# Patient Record
Sex: Female | Born: 1965 | Race: Black or African American | Hispanic: No | Smoking: Never smoker
Health system: Southern US, Community
[De-identification: ages and names within clinical notes are randomized; demographics above are authoritative.]

## PROBLEM LIST (undated history)

## (undated) DIAGNOSIS — D649 Anemia, unspecified: Secondary | ICD-10-CM

## (undated) DIAGNOSIS — N809 Endometriosis, unspecified: Secondary | ICD-10-CM

## (undated) DIAGNOSIS — K219 Gastro-esophageal reflux disease without esophagitis: Secondary | ICD-10-CM

## (undated) DIAGNOSIS — D219 Benign neoplasm of connective and other soft tissue, unspecified: Secondary | ICD-10-CM

## (undated) DIAGNOSIS — D689 Coagulation defect, unspecified: Secondary | ICD-10-CM

## (undated) DIAGNOSIS — R011 Cardiac murmur, unspecified: Secondary | ICD-10-CM

## (undated) HISTORY — DX: Endometriosis, unspecified: N80.9

## (undated) HISTORY — DX: Benign neoplasm of connective and other soft tissue, unspecified: D21.9

## (undated) HISTORY — DX: Gastro-esophageal reflux disease without esophagitis: K21.9

## (undated) HISTORY — DX: Anemia, unspecified: D64.9

## (undated) HISTORY — DX: Cardiac murmur, unspecified: R01.1

## (undated) HISTORY — DX: Coagulation defect, unspecified: D68.9

---
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
## 1989-11-09 HISTORY — PX: MYOMECTOMY ABDOMINAL APPROACH: SUR870

## 1989-11-09 HISTORY — PX: OVARIAN CYST REMOVAL: SHX89

## 1992-11-09 DIAGNOSIS — N9489 Other specified conditions associated with female genital organs and menstrual cycle: Secondary | ICD-10-CM | POA: Insufficient documentation

## 1999-01-28 ENCOUNTER — Ambulatory Visit (HOSPITAL_COMMUNITY): Admission: RE | Admit: 1999-01-28 | Discharge: 1999-01-28 | Payer: Self-pay

## 1999-02-17 ENCOUNTER — Encounter (HOSPITAL_COMMUNITY): Admission: RE | Admit: 1999-02-17 | Discharge: 1999-05-18 | Payer: Self-pay

## 2001-11-29 ENCOUNTER — Other Ambulatory Visit: Admission: RE | Admit: 2001-11-29 | Discharge: 2001-11-29 | Payer: Self-pay | Admitting: Obstetrics and Gynecology

## 2002-03-07 ENCOUNTER — Encounter: Admission: RE | Admit: 2002-03-07 | Discharge: 2002-03-07 | Payer: Self-pay | Admitting: Nephrology

## 2002-03-07 ENCOUNTER — Encounter: Payer: Self-pay | Admitting: Nephrology

## 2002-11-30 ENCOUNTER — Other Ambulatory Visit: Admission: RE | Admit: 2002-11-30 | Discharge: 2002-11-30 | Payer: Self-pay | Admitting: Obstetrics and Gynecology

## 2002-12-01 ENCOUNTER — Encounter: Payer: Self-pay | Admitting: Obstetrics and Gynecology

## 2002-12-01 ENCOUNTER — Ambulatory Visit (HOSPITAL_COMMUNITY): Admission: RE | Admit: 2002-12-01 | Discharge: 2002-12-01 | Payer: Self-pay | Admitting: Obstetrics and Gynecology

## 2003-01-10 ENCOUNTER — Ambulatory Visit (HOSPITAL_COMMUNITY): Admission: RE | Admit: 2003-01-10 | Discharge: 2003-01-10 | Payer: Self-pay | Admitting: Obstetrics and Gynecology

## 2003-01-10 ENCOUNTER — Encounter: Payer: Self-pay | Admitting: Obstetrics and Gynecology

## 2003-02-27 ENCOUNTER — Ambulatory Visit (HOSPITAL_COMMUNITY): Admission: RE | Admit: 2003-02-27 | Discharge: 2003-02-27 | Payer: Self-pay | Admitting: Obstetrics and Gynecology

## 2003-02-27 ENCOUNTER — Encounter: Payer: Self-pay | Admitting: Obstetrics and Gynecology

## 2003-03-13 ENCOUNTER — Ambulatory Visit: Admission: RE | Admit: 2003-03-13 | Discharge: 2003-03-13 | Payer: Self-pay | Admitting: Gynecologic Oncology

## 2003-10-31 ENCOUNTER — Ambulatory Visit (HOSPITAL_COMMUNITY): Admission: RE | Admit: 2003-10-31 | Discharge: 2003-10-31 | Payer: Self-pay | Admitting: Obstetrics and Gynecology

## 2003-11-23 ENCOUNTER — Ambulatory Visit (HOSPITAL_COMMUNITY): Admission: RE | Admit: 2003-11-23 | Discharge: 2003-11-23 | Payer: Self-pay | Admitting: Obstetrics and Gynecology

## 2003-11-28 ENCOUNTER — Other Ambulatory Visit: Admission: RE | Admit: 2003-11-28 | Discharge: 2003-11-28 | Payer: Self-pay | Admitting: Obstetrics and Gynecology

## 2004-11-09 HISTORY — PX: TOTAL ABDOMINAL HYSTERECTOMY W/ BILATERAL SALPINGOOPHORECTOMY: SHX83

## 2004-12-16 ENCOUNTER — Other Ambulatory Visit: Admission: RE | Admit: 2004-12-16 | Discharge: 2004-12-16 | Payer: Self-pay | Admitting: Obstetrics and Gynecology

## 2004-12-30 ENCOUNTER — Emergency Department (HOSPITAL_COMMUNITY): Admission: EM | Admit: 2004-12-30 | Discharge: 2004-12-30 | Payer: Self-pay | Admitting: Emergency Medicine

## 2005-02-23 ENCOUNTER — Ambulatory Visit: Payer: Self-pay

## 2005-02-25 ENCOUNTER — Inpatient Hospital Stay (HOSPITAL_COMMUNITY): Admission: RE | Admit: 2005-02-25 | Discharge: 2005-02-27 | Payer: Self-pay | Admitting: Obstetrics and Gynecology

## 2005-02-25 ENCOUNTER — Encounter (INDEPENDENT_AMBULATORY_CARE_PROVIDER_SITE_OTHER): Payer: Self-pay | Admitting: Specialist

## 2008-10-29 ENCOUNTER — Ambulatory Visit (HOSPITAL_COMMUNITY): Admission: RE | Admit: 2008-10-29 | Discharge: 2008-10-29 | Payer: Self-pay | Admitting: Obstetrics and Gynecology

## 2009-01-21 ENCOUNTER — Telehealth: Payer: Self-pay | Admitting: Internal Medicine

## 2009-01-21 DIAGNOSIS — K5900 Constipation, unspecified: Secondary | ICD-10-CM | POA: Insufficient documentation

## 2009-01-21 DIAGNOSIS — D279 Benign neoplasm of unspecified ovary: Secondary | ICD-10-CM | POA: Insufficient documentation

## 2009-01-21 DIAGNOSIS — K219 Gastro-esophageal reflux disease without esophagitis: Secondary | ICD-10-CM | POA: Insufficient documentation

## 2009-01-21 DIAGNOSIS — D649 Anemia, unspecified: Secondary | ICD-10-CM | POA: Insufficient documentation

## 2009-01-21 DIAGNOSIS — R12 Heartburn: Secondary | ICD-10-CM

## 2009-01-21 DIAGNOSIS — R011 Cardiac murmur, unspecified: Secondary | ICD-10-CM

## 2009-01-22 ENCOUNTER — Ambulatory Visit: Payer: Self-pay | Admitting: Gastroenterology

## 2009-01-22 ENCOUNTER — Encounter: Payer: Self-pay | Admitting: Nurse Practitioner

## 2009-01-22 DIAGNOSIS — R740 Nonspecific elevation of levels of transaminase and lactic acid dehydrogenase [LDH]: Secondary | ICD-10-CM

## 2009-01-22 DIAGNOSIS — R110 Nausea: Secondary | ICD-10-CM

## 2009-01-22 DIAGNOSIS — R109 Unspecified abdominal pain: Secondary | ICD-10-CM

## 2009-01-26 ENCOUNTER — Encounter: Payer: Self-pay | Admitting: Nurse Practitioner

## 2009-01-29 ENCOUNTER — Ambulatory Visit: Payer: Self-pay | Admitting: Nurse Practitioner

## 2009-01-29 LAB — CONVERTED CEMR LAB
ALT: 67 units/L — ABNORMAL HIGH (ref 0–35)
AST: 35 units/L (ref 0–37)
Alkaline Phosphatase: 85 units/L (ref 39–117)
Basophils Absolute: 0.1 10*3/uL (ref 0.0–0.1)
Calcium: 9.4 mg/dL (ref 8.4–10.5)
Chloride: 106 meq/L (ref 96–112)
Creatinine, Ser: 1.1 mg/dL (ref 0.4–1.2)
Eosinophils Absolute: 0.1 10*3/uL (ref 0.0–0.7)
Hemoglobin: 12.6 g/dL (ref 12.0–15.0)
Lymphocytes Relative: 37.6 % (ref 12.0–46.0)
MCHC: 32.9 g/dL (ref 30.0–36.0)
Monocytes Absolute: 0.5 10*3/uL (ref 0.1–1.0)
Neutro Abs: 4.2 10*3/uL (ref 1.4–7.7)
Neutrophils Relative %: 53.1 % (ref 43.0–77.0)
RDW: 14.9 % — ABNORMAL HIGH (ref 11.5–14.6)

## 2009-02-05 ENCOUNTER — Ambulatory Visit: Payer: Self-pay | Admitting: Nurse Practitioner

## 2009-02-06 ENCOUNTER — Ambulatory Visit: Payer: Self-pay | Admitting: Internal Medicine

## 2009-02-13 LAB — CONVERTED CEMR LAB
ALT: 48 units/L — ABNORMAL HIGH (ref 0–35)
AST: 32 units/L (ref 0–37)
Alkaline Phosphatase: 77 units/L (ref 39–117)
Bilirubin, Direct: 0.2 mg/dL (ref 0.0–0.3)
Total Bilirubin: 1.4 mg/dL — ABNORMAL HIGH (ref 0.3–1.2)

## 2009-03-09 HISTORY — PX: OTHER SURGICAL HISTORY: SHX169

## 2009-04-04 ENCOUNTER — Encounter (HOSPITAL_COMMUNITY): Admission: RE | Admit: 2009-04-04 | Discharge: 2009-05-07 | Payer: Self-pay | Admitting: Cardiology

## 2009-04-11 ENCOUNTER — Encounter: Payer: Self-pay | Admitting: Cardiology

## 2009-04-15 ENCOUNTER — Encounter: Payer: Self-pay | Admitting: Cardiology

## 2010-11-29 ENCOUNTER — Encounter: Payer: Self-pay | Admitting: Obstetrics and Gynecology

## 2011-03-27 NOTE — H&P (Signed)
NAME:  SUBRENA, DEVEREUX NO.:  [id number]   MEDICAL RECORD NO.:  [id number]          PATIENT TYPE:  INP   LOCATION:  NA                            FACILITY:  [facility]   PHYSICIAN:  Hal Morales, M.D.DATE OF BIRTH:  [birth year]   DATE OF ADMISSION:  DATE OF DISCHARGE:                                HISTORY & PHYSICAL   HISTORY OF PRESENT ILLNESS:  Ms. Bowe is a 45 year old single African-  American female who is status post bilateral tubal ligation, para 1-0-0-1  who presents for a total abdominal hysterectomy with removal of a right  adnexal mass because of chronic pelvic pain, severe dysmenorrhea, and a  persistent right adnexal mass. For the past two years, the patient has  experienced episodic right lower quadrant pelvic pain and severe  dysmenorrhea. For the past three months, the patient's intermittent pain has  become persistent and is rated 8/10 on a 10-point scale lasting several  minutes to an hour at a time. The patient has not observed any alleviating  or exacerbating factors, change in her bowel habits, urinary tract symptoms,  nausea, vomiting, diarrhea, fever, or dyspareunia with these occurrences. No  pain medications have been taken for the intermittent pain; however, the  (9/10) pain of dysmenorrhea has only been relieved by Depo-Provera. An  ultrasound in the December 2004 revealed a right  cystic structure in the  adnexa which was unchanged since a previous study done January 2004. It was  questionable as to whether patient had a hydrosalpinx. A CT scan in January  2005 was noted to have a right adnexal structure measuring 2.5 x 4.3 x 4.5  cm which correlated with the previous ultrasound done in December of 2004.  The differential diagnosis based on these findings including hydrosalpinx,  para-ovarian/ovarian cyst, or retroperitoneal inclusion cyst. Due to  patient's past history of a left salpingo-oophorectomy (age 10) due to an  endodermal  sinus tumor, a gynecologic oncology consult was obtained in May  of 2004 with Dr. Ronita Hipps. The recommendation was followup with serial  ultrasounds at six month intervals, given that the characteristics of this  lesion appeared benign. Previous AFP, CA-125, and CEA were all within normal  limits. Additionally, Dr. Saralyn Pilar, a pelvic pain specialist was  consulted who concurred with Dr. Calton Dach assessment of a likely benign  process and management plan. After careful consideration of her options  which included continue observation, removal of right adnexal mass, and  hysterectomy with removal of the right adnexal mass, the patient has decided  to proceed with hysterectomy and right adnexal mass excision as definitive  therapy for her dysmenorrhea and persistent right adnexal mass. The patient  understands that this procedure does not guarantee resolution of her daily  pelvic pain.   PAST MEDICAL HISTORY:  OB history:  Gravida 1, para 1-0-0-1 which was  delivered by Cesarean section. GYN history:  Menarche at 45 years old. The  patient's last menstrual period was March 2003 due to Depo-Provera. She uses  Depo-Provera as a method of contraception. Denies any history of sexually  transmitted diseases. The patient underwent colposcopy  in 1998 with normal  Pap smears since that time. Her last normal Pap smear was February 2006.  Medical history is positive for anemia, left ovarian tumor, vulvar  dystrophy, gastroesophageal reflux disease, mitral valve regurgitation  requiring preoperative prophylaxis.   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 1991  myomectomy and right ovarian cystectomy with partial right oophorectomy. The  patient denied any blood transfusions or problems with anesthesia.   FAMILY HISTORY:  Diabetes mellitus, hypertension, and cancer.   SOCIAL HISTORY:  The patient is single, and she works in [employer].   HABITS:  The patient  drinks alcohol on occasion, does not use tobacco.   CURRENT MEDICATIONS:  1.  Depo-Provera 150 mg IM every 12 weeks.  2.  Prilosec 20 mg 1 tablet daily.  3.  Multivitamins 1 tablet daily.   ALLERGIES:  IV CONTRAST, IODINE, and LATEX. The patient experiences a  burning sensation and ecchymosis when exposed to these products.   REVIEW OF SYSTEMS:  Seasonal allergies, sinusitis, constipation. Otherwise  review of systems is negative except as mentioned in history of present  illness.   PHYSICAL EXAMINATION:  VITAL SIGNS:  Blood pressure 112/80, weight 156,  height is 5 feet 3 inches.  GENERAL:  Neck is supple without masses. There is no adenopathy or  thyromegaly.  HEART:  Regular rate and rhythm. There is no murmur.  LUNGS:  Clear to auscultation. There are no wheezes, rales, or rhonchi.  BACK:  No CVA tenderness.  ABDOMEN:  Bowel sounds are present. It is soft without tenderness, guarding,  rebound, or organomegaly.  EXTREMITIES:  Without clubbing, cyanosis, or edema.  PELVIC:  EGBUS is within normal limits. Vagina is rugose. Cervix is  nontender without lesions. Uterus appears normal size, shape and consistency  without tenderness. Adnexa without any tenderness; however, there is a  questionable fullness in the right adnexa. Rectovaginal without masses or  tenderness.   IMPRESSION:  1.  Pelvic pain.  2.  Persistent right adnexal mass.  3.  Dysmenorrhea.  4.  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   DISPOSITION:  As previously outlined, the patient considered the options for  management of her presenting symptoms and has consented to proceed with a  total abdominal hysterectomy with removal of a right adnexal mass and  possible right oophorectomy. The patient understands the indications for  this procedures along with their risks which include reaction to anesthesia, damage to adjacent organs, infection, excessive bleeding, and the  possibility  that this procedure may not alleviate her persistent pelvic  pain. The patient has been given a copy of the [facility]  Fleet's Phospho-Soda bowel prep instructions to be administered 24 hours  prior to her surgery. She is scheduled to undergo a total abdominal  hysterectomy with removal of a right adnexal mass and possible right  oophorectomy at [facility] on February 25, 2005 at 8:30  a.m.      EJP/MEDQ  D:  02/19/2005  T:  02/19/2005  Job:  [id number]

## 2011-03-27 NOTE — Consult Note (Signed)
NAME:  Natasha Mcknight, Natasha Mcknight                    ACCOUNT NO.:  [id number]   MEDICAL RECORD NO.:  [id number]                   PATIENT TYPE:  OUT   LOCATION:  GYN                                  FACILITY:  [facility]   PHYSICIAN:  John T. Kyla Balzarine, M.D.                 DATE OF BIRTH:  April 24, 1966   DATE OF CONSULTATION:  03/13/2003  DATE OF DISCHARGE:                                   CONSULTATION   CHIEF COMPLAINT:  The patient is a 45 year old para 1, referred by Dr.  Pennie Rushing for evaluation of a persistent adnexal mass.   HISTORY OF PRESENT ILLNESS:  The patient underwent left salpingo-  oophorectomy at age 44, for an endodermal sinus tumor.  She received VAC  chemotherapy, and has been followed without evidence of recurrent disease.  In 1994, she underwent laparotomy and right ovarian cystectomy for benign  disease.  In 1/04, she was found to have a 5.5 x 3.4 x 3.5 cm cystic,  irregular mass involving the right ovary.  Followup ultrasound on 01/10/03,  was slightly smaller at 2.7 x 4.9 x 2.4 cm, and ultrasound on 02/27/03, was  stable.  The CA-125, CEA, and alpha fetoprotein are all normal.  Beta hCG  was negative.   PAST MEDICAL HISTORY:  No major comorbidity.  Germ cells seen of the ovary  as above.   PAST SURGICAL HISTORY:  1. As above.  2. Cesarean section.   SOCIAL HISTORY:  Divorced with an 25 year old son, denies tobacco, admits to  rare ethanol.   FAMILY HISTORY:  Noncontributory.   REVIEW OF SYMPTOMS:  GENERAL:  The patient denies general systemic  complaints such as weight loss, fever, chills.  CARDIOPULMONARY:  She denies  cardiopulmonary symptoms such as dyspnea, orthopnea, PND, chest pain, or  palpitations.  GASTROINTESTINAL:  No change in bowel function, early  sciatiaty, nausea or vomiting, hematemesis, or hematochezia.  GENITOURINARY:  Denies urgency, frequency, or incontinence.  OB/GYN:  As above.  EXTREMITIES:  No edema.   PHYSICAL EXAMINATION:  VITAL SIGNS:   Weight 141 pounds, blood pressure  122/82, pulse 100, respirations 20.  GENERAL:  The patient is anxious, alert and oriented x3, in no acute  distress.  HEENT:  Benign.  Clear oropharynx.  NECK:  Supple without goiter.  LYMPH SURVEY:  No pathological lymphadenopathy.  ABDOMEN:  Soft and benign with well-healed incision.  There is no ascites,  mass, or organomegaly.  There is no hernia.  There is no back or CVA  tenderness.  EXTREMITIES:  Full range of motion without edema.  PELVIC:  External genitalia and BUS are normal to inspection and palpation.  Bladder and urethra are well supported, and there are no vaginal lesions.  Cervix is without lesions and mobile.  Bimanual and rectovaginal  examinations reveal normal anteflexed uterus with a 4 cm mobile cystic mass  of the right adnexa.  This is nontender, and there is no  cul-de-sac  nodularity on rectovaginal examination.    ASSESSMENT:  1. Asymptomatic, likely benign right adnexal mass.  2. Prior history of left salpingo-oophorectomy.  3. Endodermal sinus tumor, clinically NAD.   PLAN:  The patient is reassured regarding our thoughts that this is likely  benign.  I recommended that the cyst be followed with serial ultrasounds.  Because she has had a stable mass over the past three to four months, I  would recommend simply repeating an ultrasound at six month intervals to  document stability.  She realizes that she is at risk for losing her ovary  if we attempt cystectomy, and she does not wish total abdominal hysterectomy  and bilateral salpingo-oophorectomy as definitive treatment for this.  I  answered multiple questions posed by the patient, and she appeared  satisfied.  The patient will obtain followup with Dr. Pennie Rushing and will  return to see [place] at anytime on a p.r.n. basis.                                                John T. Kyla Balzarine, M.D.    JTS/MEDQ  D:  03/13/2003  T:  03/14/2003  Job:  [id number]   cc:   Hal Morales, M.D.  [address]  [place]  [place] [address]  Fax: [phone]   Telford Nab, R.N.

## 2011-03-27 NOTE — Discharge Summary (Signed)
NAME:  Natasha Mcknight, Natasha Mcknight NO.:  [id number]   MEDICAL RECORD NO.:  [id number]          PATIENT TYPE:  INP   LOCATION:  9304                          FACILITY:  [facility]   PHYSICIAN:  Hal Morales, M.D.DATE OF BIRTH:  [birth year]   DATE OF ADMISSION:  02/25/2005  DATE OF DISCHARGE:  02/27/2005                                 DISCHARGE SUMMARY   DISCHARGE DIAGNOSES:  1.  Right ovarian cystic mass.  2.  Pelvic pain.  3.  Dysmenorrhea.  4.  Pelvic adhesions.   OPERATION:  On the date of admission, the patient underwent an exploratory  laparotomy followed by a total abdominal hysterectomy with a right salpingo-  oophorectomy, lysis of adhesions, and pelvis washings, tolerating all  procedures well.  The patient was found to have a normal sized uterus and  was status post left salpingo-oophorectomy with adhesions between the  omentum and the left posterior uterus.  The right tube and ovary were  adherent to the posterior uterus with the ovary itself being enlarged by a 5  x 3 cm cystic mass.   HISTORY OF PRESENT ILLNESS:  Natasha Mcknight is a 45 year old African-American  female para 1, 0-0-1, who presents for a total abdominal hysterectomy with  removal of a right adnexal mass because of pelvic pain, severe dysmenorrhea,  and a persistent right adnexal mass.  Please see the patient's dictated  history and physical examination for details.   PREOPERATIVE PHYSICAL EXAMINATION:  VITAL SIGNS:  Blood pressure 112/80,  weight 156, height 5 feet 3 inches tall.  GENERAL EXAM:  Within normal limits.  PELVIC EXAM:  EG/BUS within normal limits.  Vagina is rugous.  Cervix is  nontender without lesions.  Uterus appears normal size, shape, and  consistency without tenderness.  Adnexa without any tenderness.  However,  there is a questionable fullness in the right adnexa.  Rectovaginal exam is  without masses or tenderness.   HOSPITAL COURSE:  On the date of admission, the patient  underwent the  aforementioned procedures, tolerating them all well.  The postoperative  course was unremarkable with the patient tolerating a postop hemoglobin of  8.4 (preoperative hemoglobin 11.0), and declining transfusion.  By postop  day #2, the patient had resumed bowel and bladder function and was,  therefore, deemed ready for discharge home.   DISCHARGE MEDICATIONS:  1.  Phenergan 25 mg, 1 tablet q.6h. as needed for nausea.  2.  Ferrous sulfate 325 mg, 1 tablet b.i.d. for 6 weeks.  3.  Colace 100 mg, 1 tablet b.i.d. until bowel movements are regular.  4.  Ibuprofen 1 tablet with food q.6h. for 3 days and then as needed for      pain.  5.  Prilosec 20 mg, 1 tablet daily.  6.  Tylox 1-2 tablets q.4-6h. as needed for pain.   FOLLOW UP:  The patient is scheduled for a staple removal of [facility] on March 03, 2005 at 10:30 a.m.  She is also scheduled for a  6-week postoperative visit with Dr. Pennie Rushing on Apr 01, 2005 at 10:30 a.m.   DISCHARGE INSTRUCTIONS:  The  patient was given a copy of [address] [facility] postoperative instruction sheet.  She was firmly advised to avoid  driving for 2 weeks, heavy lifting for 4 weeks, and intercourse for 6 weeks.  The patient's diet was without restriction.   FINAL PATHOLOGY:  Uterus, cervix, right fallopian tube and ovary:  Cervix--  squamous metaplasia, no dysplasia identified; inactive endometrium, no  evidence of hyperplasia or malignancy; leiomyomata intramural and  subserosal; uterine serosa, fibroid adhesions with no endometriosis or  evidence of malignancy.  Right ovary and fallopian tube--tubo-ovarian  adhesions with benign ovarian follicular cyst, benign fallopian tube, no  endometrial evidence for malignancy.  Peritoneal washings were negative.      EJP/MEDQ  D:  03/24/2005  T:  03/24/2005  Job:  [id number]

## 2011-03-27 NOTE — Op Note (Signed)
NAME:  Natasha Mcknight, Natasha Mcknight NO.:  [id number]   MEDICAL RECORD NO.:  [id number]          PATIENT TYPE:  INP   LOCATION:  9399                          FACILITY:  [facility]   PHYSICIAN:  Hal Morales, M.D.DATE OF BIRTH:  05-06-1966   DATE OF PROCEDURE:  02/25/2005  DATE OF DISCHARGE:                                 OPERATIVE REPORT   PREOPERATIVE DIAGNOSES:  1.  Right adnexal mass.  2.  Pelvic pain.  3.  Dysmenorrhea.  4.  Status post left salpingo-oophorectomy with a history of endodermal      sinus tumor.   POSTOPERATIVE DIAGNOSES:  1.  Right adnexal mass.  2.  Pelvic pain.  3.  Dysmenorrhea.  4.  Status post left salpingo-oophorectomy with a history of endodermal      sinus tumor.  5.  Right ovarian cystic mass.  6.  Pelvic adhesions.   PROCEDURES:  1.  Exploratory laparotomy.  2.  Total abdominal hysterectomy.  3.  Right salpingo-oophorectomy.  4.  Lysis of adhesions.  5.  Pelvic washing.   SURGEON:  Hal Morales, M.D.   FIRST ASSISTANT:  Naima A. Normand Sloop, M.D.   ANESTHESIA:  General orotracheal.   ESTIMATED BLOOD LOSS:  Approximately 100 mL.   COMPLICATIONS:  None.   FINDINGS:  The uterus was normal-sized.  The patient was status post left  salpingo-oophorectomy with adhesions between the omentum and the left  posterior uterus.  The right tube and ovary were adherent to the posterior  uterus.  The right ovary was enlarged by a 5 x 3 cm cystic mass.  There were  no excrescences.  The evaluation of the pelvis revealed adhesions between  the omentum and the anterior peritoneum, adhesions between the omentum and  the uterus.  There were no omental masses.  Evaluation of the upper abdomen revealed a smooth liver edge.  The kidneys  were without any palpable lesions.  There were no para-aortic palpable  lesions consistent with adenopathy.   PROCEDURE:  The patient was taken to the operating room after appropriate  identification and placed on  the operating table.  After the attainment of  adequate general anesthesia, the abdomen, perineum and the vagina were  prepped with multiple layers of Techni-Care and a Foley catheter inserted  into the bladder under sterile conditions and connected to straight  drainage.  The abdomen was draped as a sterile field.  Suprapubic injection  of 0.25% Marcaine was undertaken subcutaneously for a total of 20 mL.  A  suprapubic incision was made and the abdomen opened in layers.  The  peritoneum was entered and the above-noted finding observed on examination  of pelvis and upper abdomen.  Peritoneal washings were obtained.  The self-  retaining O'Connor-O'Sullivan retractor was placed and the bowel packed  cephalad.  The uterus was grasped at each cornual region with [facility] clamps and elevated  into the operative field.  The left uterine fundus was dissected away from the overlying adherent  omentum to identify the left round ligament.  This was suture ligated and  incised and that incision taken anteriorly to form the  bladder flap.  The  right round ligament was clamped, cut, and suture ligated and then incised  along the anterior leaf of the broad ligament to meet the other side.  The  bladder was dissected sharply off the anterior cervix.  The  infundibulopelvic ligament on the right side was isolated and the ureter  identified.  The infundibulopelvic ligament was then clamped, cut, tied with a free tie  and suture ligated.  The uterine arteries on the right and left side were  skeletonized, then clamped, cut and suture ligated.  The paracervical  tissues were clamped, cut and suture ligated successively on the right and  left side as were the uterosacral ligaments.  The sutures on the uterosacral  ligaments were held.  The vaginal angles were clamped, cut and suture  ligated and the remainder of the cervix excised from the upper vagina.  The  vaginal cuff was closed with figure-of-eight  sutures of 0 Vicryl.  Copious  irrigation carried out and hemostasis noted to be adequate.  The sutures on  the uterosacral ligament and the sutures from the vaginal angles were then  tied together and hemostasis noted to be adequate after copious irrigation.  The abdominal peritoneum was closed with running suture of 2-0 Vicryl after  all instruments and sponges had been removed.  The rectus muscles were  irrigated and made hemostatic with Bovie cautery.  The rectus fascia was  closed with running suture from each apex to the midline.  The subcutaneous  tissue was made hemostatic with Bovie cautery and irrigated and the skin  incision closed with skin staples.  A sterile dressing was applied to the  incision and the patient was awakened from general anesthesia and taken to  the recovery room in satisfactory condition having tolerated procedure well,  with sponge and instrument counts correct.   SPECIMENS TO PATHOLOGY:  Right tube and ovary, uterus and cervix.      VPH/MEDQ  D:  02/25/2005  T:  02/25/2005  Job:  [id number]

## 2012-09-23 ENCOUNTER — Ambulatory Visit (INDEPENDENT_AMBULATORY_CARE_PROVIDER_SITE_OTHER): Payer: No Typology Code available for payment source | Admitting: Obstetrics and Gynecology

## 2012-09-23 ENCOUNTER — Encounter: Payer: Self-pay | Admitting: Obstetrics and Gynecology

## 2012-09-23 VITALS — BP 142/76 | Resp 16 | Ht 64.0 in | Wt 155.0 lb

## 2012-09-23 DIAGNOSIS — Z01419 Encounter for gynecological examination (general) (routine) without abnormal findings: Secondary | ICD-10-CM

## 2012-09-23 DIAGNOSIS — Z113 Encounter for screening for infections with a predominantly sexual mode of transmission: Secondary | ICD-10-CM

## 2012-09-23 NOTE — Progress Notes (Signed)
Subjective:    Natasha Mcknight is a 46 y.o. female, No obstetric history on file., who presents for an annual exam. The patient reports no complaints but wants to be checked for STDs.  Menstrual cycle:   LMP: No LMP recorded. Patient has had a hysterectomy.             Review of Systems Pertinent items are noted in HPI. Denies pelvic pain, urinary tract symptoms, vaginitis symptoms, irregular bleeding, menopausal symptoms, change in bowel habits or rectal bleeding   Objective:    BP 142/76  Resp 16  Ht 5\' 4"  (1.626 m)  Wt 155 lb (70.308 kg)  BMI 26.61 kg/m2  Wt Readings from Last 1 Encounters:  09/23/12 155 lb (70.308 kg)   Body mass index is 26.61 kg/(m^2). General Appearance: Alert, no acute distress HEENT: Grossly normal Neck / Thyroid: Supple, no thyromegaly or cervical adenopathy Lungs: Clear to auscultation bilaterally Back: No CVA tenderness Breast Exam: No masses or nodes.No dimpling, nipple retraction or discharge. Cardiovascular: Regular rate and rhythm. 2/6 Systolic murmur at LSB Gastrointestinal: Soft, non-tender, no masses or organomegaly Pelvic Exam: EGBUS-wnl, vagina-normal rugae, cervix/uterus-surgically absent adnexae-no masses  Rectovaginal: no masses and normal sphincter tone Lymphatic Exam: Non-palpable nodes in neck, clavicular,  axillary, or inguinal regions  Skin: no rashes or abnormalities Extremities: no clubbing cyanosis or edema  Neurologic: grossly normal Psychiatric: Alert and oriented  Assessment:   Routine GYN Exam STD Testing   Plan:  STD testing (GC/CT with urine)  Reviewed revised PAP guidelines for hysterectomy however, patient wants her PAP smear today  PAP sent  RTO 1 year or prn  Meiya Wisler,[email or web address]-C

## 2012-09-23 NOTE — Progress Notes (Signed)
Patient ID: ZINNIA TINDALL, female   DOB: Feb 08, 1966, 46 y.o.   MRN: [id number] Last Pap: 2009/ hyst WNL: Yes Regular Periods:no Contraception: hyst  Monthly Breast exam:no Tetanus<34yrs:yes Nl.Bladder Function:yes Daily BMs:yes Healthy Diet:yes Calcium:no Mammogram:yes Date of Mammogram: 2009 wnl Exercise:no Have often Exercise: no Seatbelt: no Abuse at home: no Stressful work:no Sigmoid-colonoscopy: no Bone Density: No PCP: Dr. Missy Sabins Change in PMH: Hypertension Change in NWG:NFAO

## 2012-09-24 LAB — GC/CHLAMYDIA PROBE AMP, URINE
Chlamydia, Swab/Urine, PCR: NEGATIVE
GC Probe Amp, Urine: NEGATIVE

## 2012-09-24 LAB — HEPATITIS B SURFACE ANTIGEN: Hepatitis B Surface Ag: NEGATIVE

## 2012-09-24 LAB — RPR

## 2012-09-24 LAB — HEPATITIS C ANTIBODY: HCV Ab: NEGATIVE

## 2012-09-26 LAB — PAP IG W/ RFLX HPV ASCU

## 2014-05-04 ENCOUNTER — Other Ambulatory Visit: Payer: Self-pay | Admitting: Obstetrics and Gynecology

## 2014-05-04 ENCOUNTER — Other Ambulatory Visit: Payer: Self-pay

## 2014-05-04 DIAGNOSIS — Z1231 Encounter for screening mammogram for malignant neoplasm of breast: Secondary | ICD-10-CM

## 2014-05-08 ENCOUNTER — Encounter (INDEPENDENT_AMBULATORY_CARE_PROVIDER_SITE_OTHER): Payer: Self-pay

## 2014-05-08 ENCOUNTER — Ambulatory Visit: Payer: Self-pay

## 2014-05-08 ENCOUNTER — Ambulatory Visit
Admission: RE | Admit: 2014-05-08 | Discharge: 2014-05-08 | Disposition: A | Discharge status: Home / Self Care | Payer: BC Managed Care – PPO | Source: Ambulatory Visit | Attending: Obstetrics and Gynecology | Admitting: Obstetrics and Gynecology

## 2014-05-08 DIAGNOSIS — Z1231 Encounter for screening mammogram for malignant neoplasm of breast: Secondary | ICD-10-CM

## 2015-02-20 DIAGNOSIS — I10 Essential (primary) hypertension: Secondary | ICD-10-CM | POA: Diagnosis present

## 2016-12-21 ENCOUNTER — Encounter (HOSPITAL_COMMUNITY): Payer: Self-pay | Admitting: Emergency Medicine

## 2016-12-21 ENCOUNTER — Emergency Department (HOSPITAL_COMMUNITY)
Admission: EM | Admit: 2016-12-21 | Discharge: 2016-12-21 | Disposition: A | Discharge status: Home / Self Care | Payer: 59 | Attending: Emergency Medicine | Admitting: Emergency Medicine

## 2016-12-21 DIAGNOSIS — S63502A Unspecified sprain of left wrist, initial encounter: Secondary | ICD-10-CM

## 2016-12-21 DIAGNOSIS — Y939 Activity, unspecified: Secondary | ICD-10-CM | POA: Diagnosis not present

## 2016-12-21 DIAGNOSIS — S199XXA Unspecified injury of neck, initial encounter: Secondary | ICD-10-CM | POA: Diagnosis present

## 2016-12-21 DIAGNOSIS — S63501A Unspecified sprain of right wrist, initial encounter: Secondary | ICD-10-CM | POA: Insufficient documentation

## 2016-12-21 DIAGNOSIS — Z91040 Latex allergy status: Secondary | ICD-10-CM | POA: Diagnosis not present

## 2016-12-21 DIAGNOSIS — Y92410 Unspecified street and highway as the place of occurrence of the external cause: Secondary | ICD-10-CM | POA: Insufficient documentation

## 2016-12-21 DIAGNOSIS — S161XXA Strain of muscle, fascia and tendon at neck level, initial encounter: Secondary | ICD-10-CM | POA: Diagnosis not present

## 2016-12-21 DIAGNOSIS — Y999 Unspecified external cause status: Secondary | ICD-10-CM | POA: Insufficient documentation

## 2016-12-21 MED ORDER — CYCLOBENZAPRINE HCL 5 MG PO TABS: 5.0000 mg | ORAL_TABLET | Freq: Three times a day (TID) | ORAL | 0 refills | Status: DC | PRN

## 2016-12-21 MED ORDER — DICLOFENAC SODIUM 50 MG PO TBEC: 50.0000 mg | DELAYED_RELEASE_TABLET | Freq: Two times a day (BID) | ORAL | 0 refills | Status: AC
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
## 2016-12-25 ENCOUNTER — Emergency Department (HOSPITAL_COMMUNITY)
Admission: EM | Admit: 2016-12-25 | Discharge: 2016-12-25 | Disposition: A | Discharge status: Home / Self Care | Payer: 59 | Attending: Emergency Medicine | Admitting: Emergency Medicine

## 2016-12-25 ENCOUNTER — Encounter (HOSPITAL_COMMUNITY): Payer: Self-pay | Admitting: Nurse Practitioner

## 2016-12-25 ENCOUNTER — Emergency Department (HOSPITAL_COMMUNITY): Payer: 59

## 2016-12-25 DIAGNOSIS — M546 Pain in thoracic spine: Secondary | ICD-10-CM | POA: Diagnosis not present

## 2016-12-25 DIAGNOSIS — Z79899 Other long term (current) drug therapy: Secondary | ICD-10-CM | POA: Diagnosis not present

## 2016-12-25 DIAGNOSIS — Z91040 Latex allergy status: Secondary | ICD-10-CM | POA: Diagnosis not present

## 2016-12-25 DIAGNOSIS — S63501D Unspecified sprain of right wrist, subsequent encounter: Secondary | ICD-10-CM

## 2016-12-25 DIAGNOSIS — S63502D Unspecified sprain of left wrist, subsequent encounter: Secondary | ICD-10-CM | POA: Diagnosis not present

## 2016-12-25 DIAGNOSIS — S6992XD Unspecified injury of left wrist, hand and finger(s), subsequent encounter: Secondary | ICD-10-CM | POA: Diagnosis present

## 2016-12-25 MED ORDER — LIDOCAINE 5 % EX PTCH: 1.0000 | MEDICATED_PATCH | CUTANEOUS | 0 refills | Status: DC
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
## 2016-12-25 NOTE — ED Notes (Signed)
Pt left after having splints placed. Pt did not get VS or sign DC.

## 2016-12-25 NOTE — Progress Notes (Signed)
Orthopedic Tech Progress Note Patient Details:  Natasha Mcknight April 10, 1966 [id number]  Ortho Devices Type of Ortho Device: Velcro wrist splint Ortho Device/Splint Location: Applied Velcro wrist splints to right and left hand/wrist bilateral.  Pt tolerated well.  Ortho Device/Splint Interventions: Application, Adjustment   Klarke, Kenealy 12/25/2016, 8:32 PM
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
## 2016-12-25 NOTE — ED Notes (Signed)
Pt returned to room (from XR). 

## 2016-12-25 NOTE — ED Notes (Signed)
Pt ambulated to room from waiting room, tolerated well. 
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
## 2016-12-25 NOTE — Discharge Instructions (Signed)
Wear the wrist splints for comfort and support. Continue to take the medications from your previous visit. Use the pain patch on the area of your back that is hurting. Follow up with Dr. Stann Mainland if you symptoms are not improving. Return here as needed.

## 2016-12-25 NOTE — ED Notes (Signed)
Patient transported to X-ray 

## 2017-08-31 ENCOUNTER — Other Ambulatory Visit: Payer: Self-pay | Admitting: Obstetrics and Gynecology

## 2017-08-31 ENCOUNTER — Other Ambulatory Visit: Payer: Self-pay | Admitting: Family Medicine

## 2017-08-31 DIAGNOSIS — Z1231 Encounter for screening mammogram for malignant neoplasm of breast: Secondary | ICD-10-CM

## 2017-09-22 ENCOUNTER — Ambulatory Visit
Admission: RE | Admit: 2017-09-22 | Discharge: 2017-09-22 | Disposition: A | Discharge status: Home / Self Care | Payer: 59 | Source: Ambulatory Visit | Attending: Family Medicine | Admitting: Family Medicine

## 2017-09-22 DIAGNOSIS — Z1231 Encounter for screening mammogram for malignant neoplasm of breast: Secondary | ICD-10-CM

## 2018-09-13 ENCOUNTER — Other Ambulatory Visit: Payer: Self-pay | Admitting: Family Medicine

## 2018-09-13 DIAGNOSIS — Z1231 Encounter for screening mammogram for malignant neoplasm of breast: Secondary | ICD-10-CM

## 2018-10-27 ENCOUNTER — Ambulatory Visit
Admission: RE | Admit: 2018-10-27 | Discharge: 2018-10-27 | Disposition: A | Discharge status: Home / Self Care | Payer: 59 | Source: Ambulatory Visit | Attending: Family Medicine | Admitting: Family Medicine

## 2018-10-27 DIAGNOSIS — Z1231 Encounter for screening mammogram for malignant neoplasm of breast: Secondary | ICD-10-CM

## 2020-04-19 IMAGING — MG DIGITAL SCREENING BILATERAL MAMMOGRAM WITH CAD
4 series · 4 of 4 positions shown · non-contrast
Comparison: Previous exam(s).

CLINICAL DATA: Screening.

EXAM:
DIGITAL SCREENING BILATERAL MAMMOGRAM WITH CAD

[L MLO]
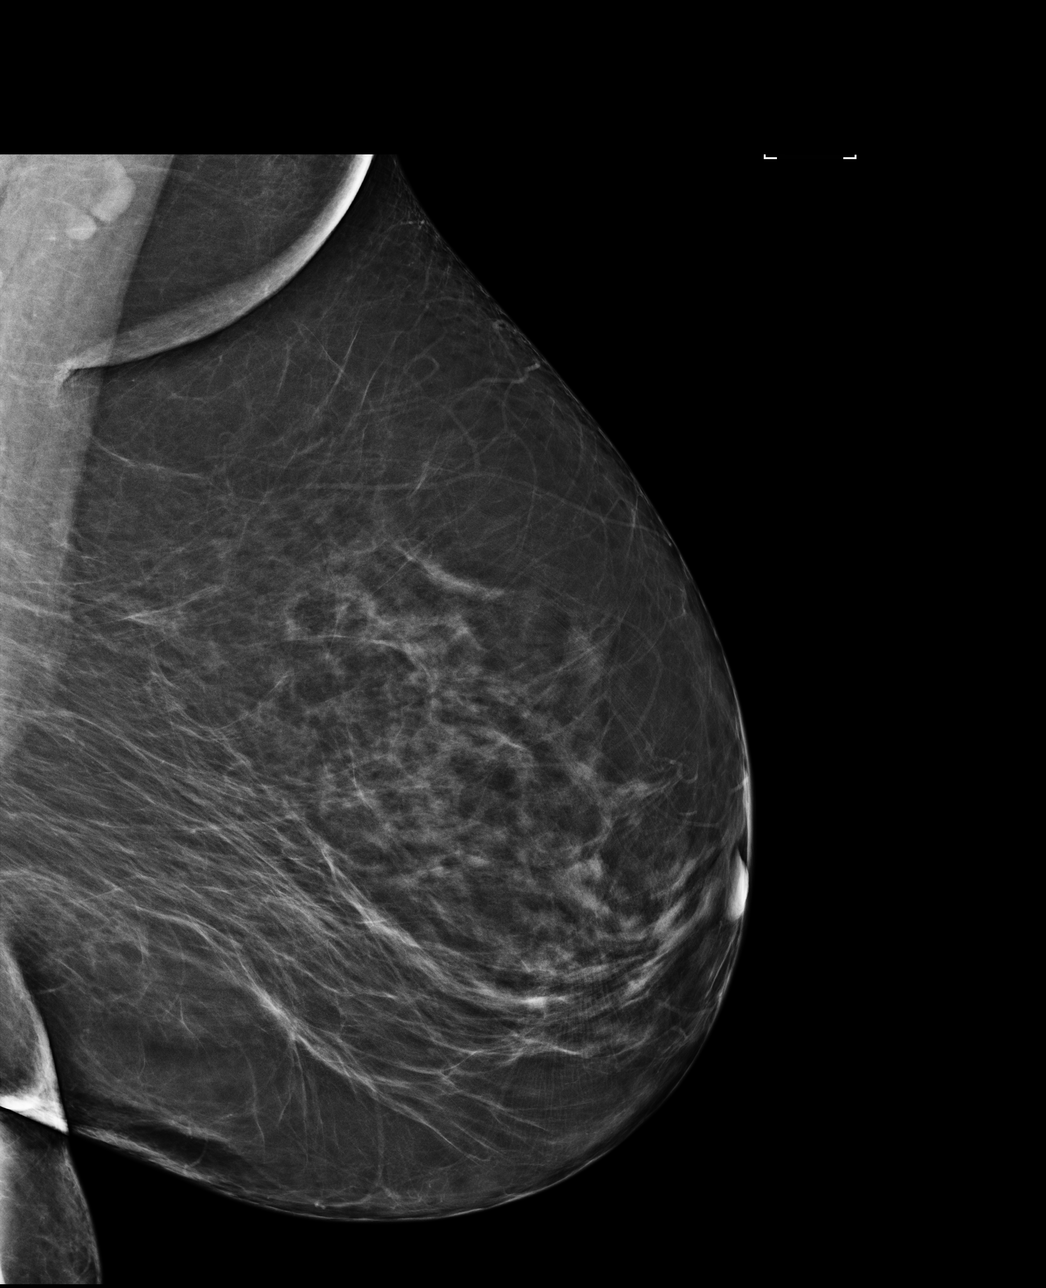

[R CC]
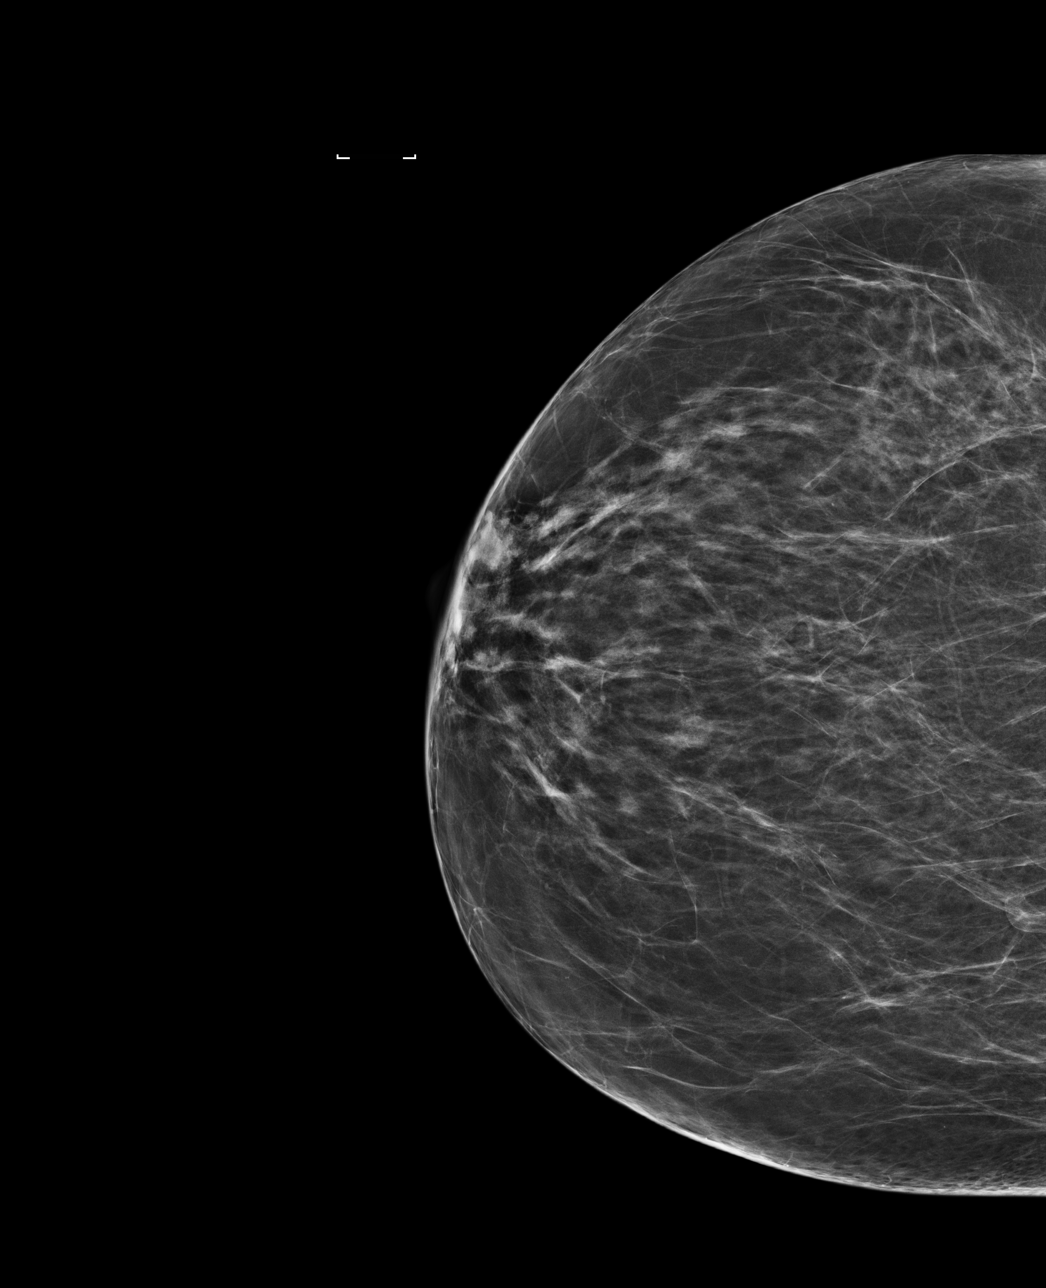

[R MLO]
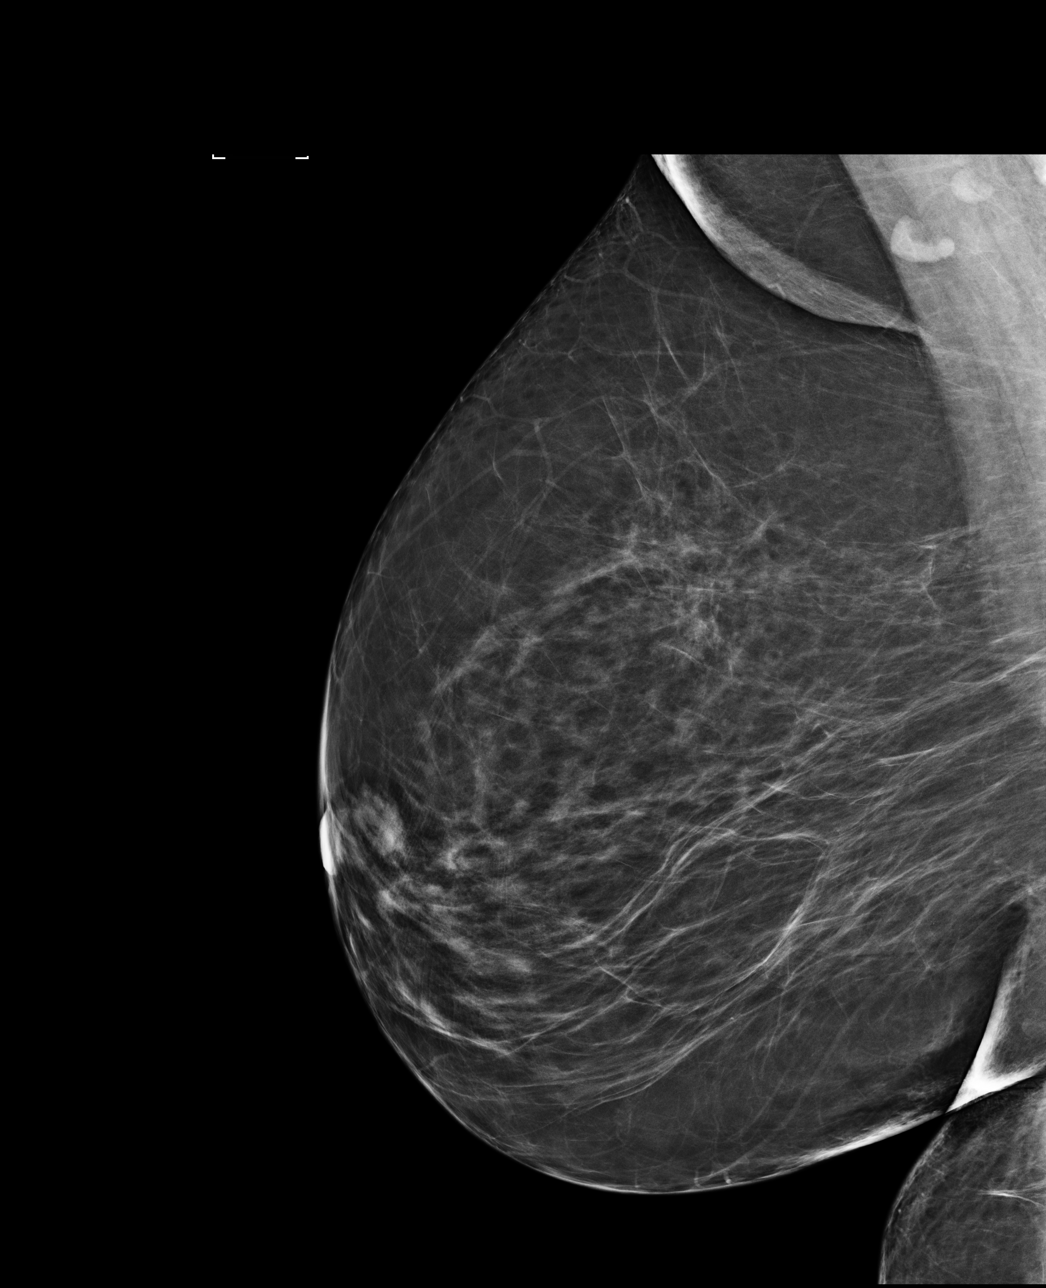

[L CC]
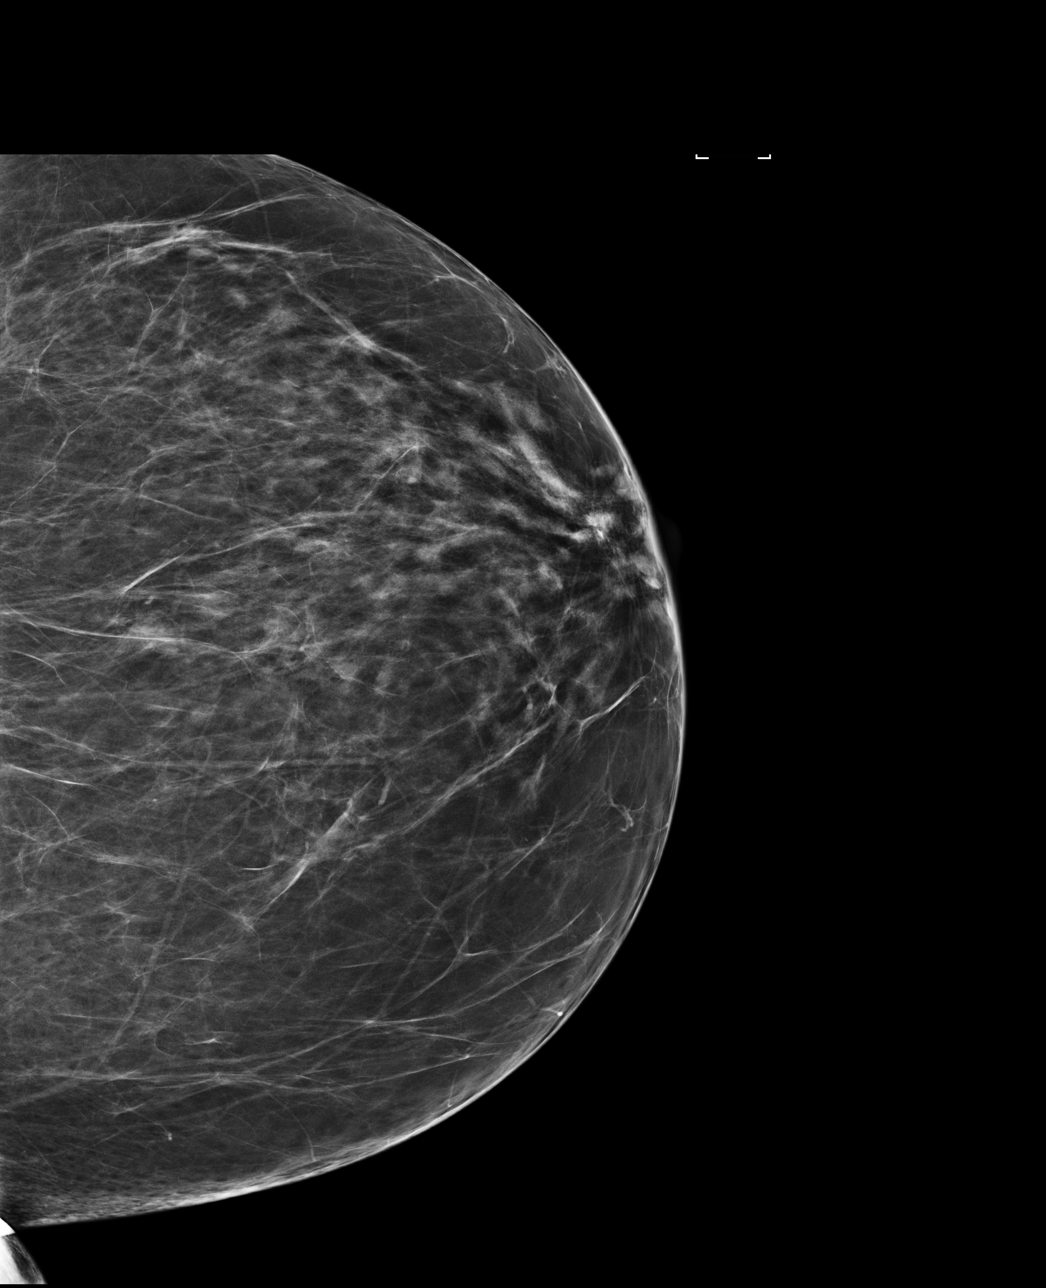

[4 of 4 positions shown; findings below may reference images not displayed]

ACR Breast Density Category b: There are scattered areas of
fibroglandular density.
FINDINGS: There are no findings suspicious for malignancy. Images were
processed with CAD.
IMPRESSION: No mammographic evidence of malignancy. A result letter of this
screening mammogram will be mailed directly to the patient.

RECOMMENDATION:
Screening mammogram in one year. (Code:AS-G-LCT)

BI-RADS CATEGORY  1: Negative.

## 2022-12-29 ENCOUNTER — Other Ambulatory Visit: Payer: Self-pay | Admitting: Family Medicine

## 2022-12-29 DIAGNOSIS — Z1231 Encounter for screening mammogram for malignant neoplasm of breast: Secondary | ICD-10-CM

## 2022-12-30 NOTE — Progress Notes (Unsigned)
{  [id number]} 

## 2023-02-16 ENCOUNTER — Ambulatory Visit
Admission: RE | Admit: 2023-02-16 | Discharge: 2023-02-16 | Disposition: A | Discharge status: Home / Self Care | Payer: 59 | Source: Ambulatory Visit | Attending: Family Medicine | Admitting: Family Medicine

## 2023-02-16 DIAGNOSIS — Z1231 Encounter for screening mammogram for malignant neoplasm of breast: Secondary | ICD-10-CM

## 2024-02-07 ENCOUNTER — Other Ambulatory Visit: Payer: Self-pay | Admitting: Internal Medicine

## 2024-02-07 DIAGNOSIS — Z1231 Encounter for screening mammogram for malignant neoplasm of breast: Secondary | ICD-10-CM

## 2024-02-17 ENCOUNTER — Ambulatory Visit

## 2024-02-18 ENCOUNTER — Ambulatory Visit
Admission: RE | Admit: 2024-02-18 | Discharge: 2024-02-18 | Disposition: A | Discharge status: Home / Self Care | Source: Ambulatory Visit | Attending: Internal Medicine | Admitting: Internal Medicine

## 2024-02-18 DIAGNOSIS — Z1231 Encounter for screening mammogram for malignant neoplasm of breast: Secondary | ICD-10-CM

## 2024-08-08 DIAGNOSIS — Z952 Presence of prosthetic heart valve: Secondary | ICD-10-CM

## 2024-08-15 ENCOUNTER — Telehealth (HOSPITAL_COMMUNITY): Payer: Self-pay

## 2024-08-15 NOTE — Telephone Encounter (Signed)
 Outside/paper referral received from [facility]. Will fax over request for MD order and EKG if patient confirms interest. Insurance benefits and eligibility to be determined.   Attempted to call patient regarding interest in cardiac rehab- no answer, left message. Sent MyChart message.

## 2024-08-21 ENCOUNTER — Telehealth (HOSPITAL_COMMUNITY): Payer: Self-pay

## 2024-08-21 NOTE — Telephone Encounter (Signed)
 Patient called back, confirmed interest in cardiac rehab. Faxing EKG and MD order to Dr. Dorita office. Patient states her f/u is 10/23 with cardiologist.

## 2024-09-12 ENCOUNTER — Telehealth (HOSPITAL_COMMUNITY): Payer: Self-pay

## 2024-09-12 NOTE — Telephone Encounter (Signed)
 Pt called to see when she is able to get in to cardiac rehab, I advised pt that we are still waiting on the MD order and the 12 Lead EKG to come back from Dr. Dorita office and pt stated that she has a f/u with Dr. Emeline on 11/7 which she has to complete that appt before starting cardiac rehab as well. Pt understood and will mention to them to send over the requested documentation.

## 2024-09-20 ENCOUNTER — Telehealth (HOSPITAL_COMMUNITY): Payer: Self-pay

## 2024-09-20 NOTE — Telephone Encounter (Signed)
 Received MD order for cardiac rehab, still havent received 12 Lead EKG tracing. Called and left a message for Dr. Dorita nurse to send that EKG tracing over.

## 2024-09-25 ENCOUNTER — Telehealth (HOSPITAL_COMMUNITY): Payer: Self-pay

## 2024-09-25 NOTE — Telephone Encounter (Signed)
 Called patient to see if she was interested in participating in the Cardiac Rehab Program. Patient will come in for orientation on 12/04 and will attend the 12:30 exercise class.  Sent MyChart message.

## 2024-09-25 NOTE — Telephone Encounter (Signed)
 Pt insurance is active and benefits verified through [facility]. Co-pay $0, DED $2,000/$2,000 met, out of pocket $5,000/$5,000 met, co-insurance 20%. No pre-authorization required. 09/25/2024 @ 2:53pm, spoke with Norleen LABOR., REF# [id number].  TCR/ICR? ICR Visit(date of service)limitation? No Can multiple codes be used on the same date of service/visit?(IF ITS A LIMIT) N/A  Is this a lifetime maximum or an annual maximum? Annual Has the member used any of these services to date? No Is there a time limit (weeks/months) on start of program and/or program completion? No

## 2024-10-12 ENCOUNTER — Ambulatory Visit: Payer: Self-pay | Admitting: Cardiology

## 2024-10-12 ENCOUNTER — Encounter (HOSPITAL_COMMUNITY)
Admission: RE | Admit: 2024-10-12 | Discharge: 2024-10-12 | Disposition: A | Source: Ambulatory Visit | Attending: Cardiology

## 2024-10-12 VITALS — Ht 64.0 in | Wt 150.6 lb

## 2024-10-12 DIAGNOSIS — Z952 Presence of prosthetic heart valve: Secondary | ICD-10-CM | POA: Insufficient documentation

## 2024-10-12 LAB — GLUCOSE, CAPILLARY: Glucose-Capillary: 87 mg/dL (ref 70–99)

## 2024-10-12 NOTE — Progress Notes (Signed)
 Cardiac Individual Treatment Plan  Patient Details  Name: Natasha Mcknight MRN: [id number] Date of Birth: 1966/03/21 Referring Provider:   Flowsheet Row INTENSIVE CARDIAC REHAB ORIENT from 10/12/2024 in [facility] for Heart, Vascular, & Lung Health  Referring Provider Dr. Emeline Earley Bihari, MD covering)    Initial Encounter Date:  Flowsheet Row INTENSIVE CARDIAC REHAB ORIENT from 10/12/2024 in [facility] for Heart, Vascular, & Lung Health  Date 10/12/24    Visit Diagnosis: S/P mitral valve replacement  S/P AVR (aortic valve replacement)  Patient's Home Medications on Admission:  Current Outpatient Medications:    cyclobenzaprine  (FLEXERIL ) 5 MG tablet, Take 1 tablet (5 mg total) by mouth 3 (three) times daily as needed for muscle spasms., Disp: 30 tablet, Rfl: 0   diclofenac  (VOLTAREN ) 50 MG EC tablet, Take 1 tablet (50 mg total) by mouth 2 (two) times daily., Disp: 15 tablet, Rfl: 0   furosemide (LASIX) 40 MG tablet, Take 40 mg by mouth daily., Disp: , Rfl:    lidocaine  (LIDODERM ) 5 %, Place 1 patch onto the skin daily. Remove & Discard patch within 12 hours or as directed by MD, Disp: 30 patch, Rfl: 0   lisinopril (PRINIVIL,ZESTRIL) 5 MG tablet, Take 5 mg by mouth daily., Disp: , Rfl:    omeprazole (PRILOSEC) 20 MG capsule, Take 20 mg by mouth daily., Disp: , Rfl:    zolpidem (AMBIEN) 10 MG tablet, Take 10 mg by mouth at bedtime as needed., Disp: , Rfl:   Past Medical History: Past Medical History:  Diagnosis Date   Anemia    Clotting disorder    Endometriosis    Fibroids    GERD (gastroesophageal reflux disease)    Murmur, cardiac     Tobacco Use: Social History   Tobacco Use  Smoking Status Never  Smokeless Tobacco Not on file    Labs: Review Flowsheet        No data to display          Capillary Blood Glucose: Lab Results  Component Value Date   GLUCAP 87 10/12/2024     Exercise Target  Goals: Exercise Program Goal: Individual exercise prescription set using results from initial 6 min walk test and THRR while considering  patient's activity barriers and safety.   Exercise Prescription Goal: Initial exercise prescription builds to 30-45 minutes a day of aerobic activity, 2-3 days per week.  Home exercise guidelines will be given to patient during program as part of exercise prescription that the participant will acknowledge.  Activity Barriers & Risk Stratification:  Activity Barriers & Cardiac Risk Stratification - 10/12/24 1647       Activity Barriers & Cardiac Risk Stratification   Activity Barriers Deconditioning;Back Problems    Cardiac Risk Stratification High   Under 5 MEt's on walk test         6 Minute Walk:  6 Minute Walk     Row Name 10/12/24 1636         6 Minute Walk   Phase Initial     Distance 1155 feet     Walk Time 6 minutes     # of Rest Breaks 0     MPH 2.19     METS 3.53     RPE 9     Perceived Dyspnea  0     VO2 Peak 12.34     Symptoms Yes (comment)     Comments last lap 1 second stink feeling pain in  chest. Resolved instant, none additional. She says maybe 1/10 pain just enough to notice. Nurse aware. No over symptom     Resting HR 97 bpm     Resting BP 138/72     Resting Oxygen Saturation  99 %     Exercise Oxygen Saturation  during 6 min walk 100 %     Max Ex. HR 109 bpm     Max Ex. BP 144/80     2 Minute Post BP 138/80        Oxygen Initial Assessment:   Oxygen Re-Evaluation:   Oxygen Discharge (Final Oxygen Re-Evaluation):   Initial Exercise Prescription:  Initial Exercise Prescription - 10/12/24 1300       Date of Initial Exercise RX and Referring Provider   Date 10/12/24    Referring Provider Dr. Emeline Earley Bihari, MD covering)    Expected Discharge Date 01/03/25      Treadmill   MPH 2.4    Grade 0    Minutes 15    METs 2.84      NuStep   Level 1    SPM 70    Minutes 1.8      Prescription  Details   Frequency (times per week) 3    Duration Progress to 30 minutes of continuous aerobic without signs/symptoms of physical distress      Intensity   THRR 40-80% of Max Heartrate 65-130    Ratings of Perceived Exertion 11-13    Perceived Dyspnea 0-4      Progression   Progression Continue progressive overload as per policy without signs/symptoms or physical distress.      Resistance Training   Training Prescription Yes    Weight 2    Reps 10-15          Perform Capillary Blood Glucose checks as needed.  Exercise Prescription Changes:   Exercise Comments:   Exercise Goals and Review:   Exercise Goals     Row Name 10/12/24 1325             Exercise Goals   Increase Physical Activity Yes       Intervention Provide advice, education, support and counseling about physical activity/exercise needs.;Develop an individualized exercise prescription for aerobic and resistive training based on initial evaluation findings, risk stratification, comorbidities and participant's personal goals.       Expected Outcomes Short Term: Attend rehab on a regular basis to increase amount of physical activity.;Long Term: Exercising regularly at least 3-5 days a week.;Long Term: Add in home exercise to make exercise part of routine and to increase amount of physical activity.       Increase Strength and Stamina Yes       Intervention Provide advice, education, support and counseling about physical activity/exercise needs.;Develop an individualized exercise prescription for aerobic and resistive training based on initial evaluation findings, risk stratification, comorbidities and participant's personal goals.       Expected Outcomes Short Term: Increase workloads from initial exercise prescription for resistance, speed, and METs.;Short Term: Perform resistance training exercises routinely during rehab and add in resistance training at home;Long Term: Improve cardiorespiratory fitness,  muscular endurance and strength as measured by increased METs and functional capacity ( )       Able to understand and use rate of perceived exertion (RPE) scale Yes       Intervention Provide education and explanation on how to use RPE scale       Expected Outcomes Short Term: Able to use RPE  daily in rehab to express subjective intensity level;Long Term:  Able to use RPE to guide intensity level when exercising independently       Knowledge and understanding of Target Heart Rate Range (THRR) Yes       Intervention Provide education and explanation of THRR including how the numbers were predicted and where they are located for reference       Expected Outcomes Short Term: Able to state/look up THRR;Long Term: Able to use THRR to govern intensity when exercising independently;Short Term: Able to use daily as guideline for intensity in rehab       Understanding of Exercise Prescription Yes       Intervention Provide education, explanation, and written materials on patient's individual exercise prescription       Expected Outcomes Short Term: Able to explain program exercise prescription;Long Term: Able to explain home exercise prescription to exercise independently          Exercise Goals Re-Evaluation :   Discharge Exercise Prescription (Final Exercise Prescription Changes):   Nutrition:  Target Goals: Understanding of nutrition guidelines, daily intake of sodium 1500mg , cholesterol 200mg , calories 30% from fat and 7% or less from saturated fats, daily to have 5 or more servings of fruits and vegetables.  Biometrics:  Pre Biometrics - 10/12/24 1649       Pre Biometrics   Height 5' 4 (1.626 m)    Weight 68.3 kg    Waist Circumference 35.5 inches    Hip Circumference 36 inches    Waist to Hip Ratio 0.99 %    BMI (Calculated) 25.83    Triceps Skinfold 7 mm    % Body Fat 30.4 %    Grip Strength 16 kg    Flexibility 11.5 in    Single Leg Stand 30 seconds            Nutrition Therapy Plan and Nutrition Goals:   Nutrition Assessments:  MEDIFICTS Score Key: >=70 Need to make dietary changes  40-70 Heart Healthy Diet <= 40 Therapeutic Level Cholesterol Diet    Picture Your Plate Scores: <59 Unhealthy dietary pattern with much room for improvement. 41-50 Dietary pattern unlikely to meet recommendations for good health and room for improvement. 51-60 More healthful dietary pattern, with some room for improvement.  >60 Healthy dietary pattern, although there may be some specific behaviors that could be improved.    Nutrition Goals Re-Evaluation:   Nutrition Goals Re-Evaluation:   Nutrition Goals Discharge (Final Nutrition Goals Re-Evaluation):   Psychosocial: Target Goals: Acknowledge presence or absence of significant depression and/or stress, maximize coping skills, provide positive support system. Participant is able to verbalize types and ability to use techniques and skills needed for reducing stress and depression.  Initial Review & Psychosocial Screening:  Initial Psych Review & Screening - 10/12/24 1650       Initial Review   Current issues with Current Stress Concerns    Source of Stress Concerns Financial    Comments She has some mild stress about not working and her finances, but returns to work in 3 weeks      Family Dynamics   Good Support System? Yes      Barriers   Psychosocial barriers to participate in program The patient should benefit from training in stress management and relaxation.      Screening Interventions   Interventions Encouraged to exercise;Provide feedback about the scores to participant    Expected Outcomes Long Term Goal: Stressors or current issues are controlled  or eliminated.;Short Term goal: Identification and review with participant of any Quality of Life or Depression concerns found by scoring the questionnaire.;Long Term goal: The participant improves quality of Life and PHQ9 Scores as seen  by post scores and/or verbalization of changes          Quality of Life Scores:  Quality of Life - 10/12/24 1651       Quality of Life   Select Quality of Life      Quality of Life Scores   Health/Function Pre 25.2 %    Socioeconomic Pre 29.06 %    Psych/Spiritual Pre 30 %    Family Pre 25.2 %    GLOBAL Pre 27.04 %         Scores of 19 and below usually indicate a poorer quality of life in these areas.  A difference of  2-3 points is a clinically meaningful difference.  A difference of 2-3 points in the total score of the Quality of Life Index has been associated with significant improvement in overall quality of life, self-image, physical symptoms, and general health in studies assessing change in quality of life.  PHQ-9: Review Flowsheet       10/12/2024  Depression screen PHQ 2/9  Decreased Interest 1  Down, Depressed, Hopeless 0  PHQ - 2 Score 1  Altered sleeping 0  Tired, decreased energy 1  Change in appetite 1  Feeling bad or failure about yourself  0  Trouble concentrating 0  Moving slowly or fidgety/restless 0  Suicidal thoughts 0  PHQ-9 Score 3  Difficult doing work/chores Not difficult at all   Interpretation of Total Score  Total Score Depression Severity:  1-4 = Minimal depression, 5-9 = Mild depression, 10-14 = Moderate depression, 15-19 = Moderately severe depression, 20-27 = Severe depression   Psychosocial Evaluation and Intervention:   Psychosocial Re-Evaluation:   Psychosocial Discharge (Final Psychosocial Re-Evaluation):   Vocational Rehabilitation: Provide vocational rehab assistance to qualifying candidates.   Vocational Rehab Evaluation & Intervention:   Education: Education Goals: Education classes will be provided on a weekly basis, covering required topics. Participant will state understanding/return demonstration of topics presented.     Core Videos: Exercise    Move It!  Clinical staff conducted group or individual  video education with verbal and written material and guidebook.  Patient learns the recommended Pritikin exercise program. Exercise with the goal of living a long, healthy life. Some of the health benefits of exercise include controlled diabetes, healthier blood pressure levels, improved cholesterol levels, improved heart and lung capacity, improved sleep, and better body composition. Everyone should speak with their doctor before starting or changing an exercise routine.  Biomechanical Limitations Clinical staff conducted group or individual video education with verbal and written material and guidebook.  Patient learns how biomechanical limitations can impact exercise and how we can mitigate and possibly overcome limitations to have an impactful and balanced exercise routine.  Body Composition Clinical staff conducted group or individual video education with verbal and written material and guidebook.  Patient learns that body composition (ratio of muscle mass to fat mass) is a key component to assessing overall fitness, rather than body weight alone. Increased fat mass, especially visceral belly fat, can put us  at increased risk for metabolic syndrome, type 2 diabetes, heart disease, and even death. It is recommended to combine diet and exercise (cardiovascular and resistance training) to improve your body composition. Seek guidance from your physician and exercise physiologist before implementing an exercise routine.  Exercise Action Plan Clinical staff conducted group or individual video education with verbal and written material and guidebook.  Patient learns the recommended strategies to achieve and enjoy long-term exercise adherence, including variety, self-motivation, self-efficacy, and positive decision making. Benefits of exercise include fitness, good health, weight management, more energy, better sleep, less stress, and overall well-being.  Medical   Heart Disease Risk Reduction Clinical  staff conducted group or individual video education with verbal and written material and guidebook.  Patient learns our heart is our most vital organ as it circulates oxygen, nutrients, white blood cells, and hormones throughout the entire body, and carries waste away. Data supports a plant-based eating plan like the Pritikin Program for its effectiveness in slowing progression of and reversing heart disease. The video provides a number of recommendations to address heart disease.   Metabolic Syndrome and Belly Fat  Clinical staff conducted group or individual video education with verbal and written material and guidebook.  Patient learns what metabolic syndrome is, how it leads to heart disease, and how one can reverse it and keep it from coming back. You have metabolic syndrome if you have 3 of the following 5 criteria: abdominal obesity, high blood pressure, high triglycerides, low HDL cholesterol, and high blood sugar.  Hypertension and Heart Disease Clinical staff conducted group or individual video education with verbal and written material and guidebook.  Patient learns that high blood pressure, or hypertension, is very common in the [place] . Hypertension is largely due to excessive salt intake, but other important risk factors include being overweight, physical inactivity, drinking too much alcohol, smoking, and not eating enough potassium from fruits and vegetables. High blood pressure is a leading risk factor for heart attack, stroke, congestive heart failure, dementia, kidney failure, and premature death. Long-term effects of excessive salt intake include stiffening of the arteries and thickening of heart muscle and organ damage. Recommendations include ways to reduce hypertension and the risk of heart disease.  Diseases of Our Time - Focusing on Diabetes Clinical staff conducted group or individual video education with verbal and written material and guidebook.  Patient learns why the  best way to stop diseases of our time is prevention, through food and other lifestyle changes. Medicine (such as prescription pills and surgeries) is often only a Band-Aid on the problem, not a long-term solution. Most common diseases of our time include obesity, type 2 diabetes, hypertension, heart disease, and cancer. The Pritikin Program is recommended and has been proven to help reduce, reverse, and/or prevent the damaging effects of metabolic syndrome.  Nutrition   Overview of the Pritikin Eating Plan  Clinical staff conducted group or individual video education with verbal and written material and guidebook.  Patient learns about the Pritikin Eating Plan for disease risk reduction. The Pritikin Eating Plan emphasizes a wide variety of unrefined, minimally-processed carbohydrates, like fruits, vegetables, whole grains, and legumes. Go, Caution, and Stop food choices are explained. Plant-based and lean animal proteins are emphasized. Rationale provided for low sodium intake for blood pressure control, low added sugars for blood sugar stabilization, and low added fats and oils for coronary artery disease risk reduction and weight management.  Calorie Density  Clinical staff conducted group or individual video education with verbal and written material and guidebook.  Patient learns about calorie density and how it impacts the Pritikin Eating Plan. Knowing the characteristics of the food you choose will help you decide whether those foods will lead to weight gain or weight loss, and whether  you want to consume more or less of them. Weight loss is usually a side effect of the Pritikin Eating Plan because of its focus on low calorie-dense foods.  Label Reading  Clinical staff conducted group or individual video education with verbal and written material and guidebook.  Patient learns about the Pritikin recommended label reading guidelines and corresponding recommendations regarding calorie density,  added sugars, sodium content, and whole grains.  Dining Out - Part 1  Clinical staff conducted group or individual video education with verbal and written material and guidebook.  Patient learns that restaurant meals can be sabotaging because they can be so high in calories, fat, sodium, and/or sugar. Patient learns recommended strategies on how to positively address this and avoid unhealthy pitfalls.  Facts on Fats  Clinical staff conducted group or individual video education with verbal and written material and guidebook.  Patient learns that lifestyle modifications can be just as effective, if not more so, as many medications for lowering your risk of heart disease. A Pritikin lifestyle can help to reduce your risk of inflammation and atherosclerosis (cholesterol build-up, or plaque, in the artery walls). Lifestyle interventions such as dietary choices and physical activity address the cause of atherosclerosis. A review of the types of fats and their impact on blood cholesterol levels, along with dietary recommendations to reduce fat intake is also included.  Nutrition Action Plan  Clinical staff conducted group or individual video education with verbal and written material and guidebook.  Patient learns how to incorporate Pritikin recommendations into their lifestyle. Recommendations include planning and keeping personal health goals in mind as an important part of their success.  Healthy Mind-Set    Healthy Minds, Bodies, Hearts  Clinical staff conducted group or individual video education with verbal and written material and guidebook.  Patient learns how to identify when they are stressed. Video will discuss the impact of that stress, as well as the many benefits of stress management. Patient will also be introduced to stress management techniques. The way we think, act, and feel has an impact on our hearts.  How Our Thoughts Can Heal Our Hearts  Clinical staff conducted group or  individual video education with verbal and written material and guidebook.  Patient learns that negative thoughts can cause depression and anxiety. This can result in negative lifestyle behavior and serious health problems. Cognitive behavioral therapy is an effective method to help control our thoughts in order to change and improve our emotional outlook.  Additional Videos:  Exercise    Improving Performance  Clinical staff conducted group or individual video education with verbal and written material and guidebook.  Patient learns to use a non-linear approach by alternating intensity levels and lengths of time spent exercising to help burn more calories and lose more body fat. Cardiovascular exercise helps improve heart health, metabolism, hormonal balance, blood sugar control, and recovery from fatigue. Resistance training improves strength, endurance, balance, coordination, reaction time, metabolism, and muscle mass. Flexibility exercise improves circulation, posture, and balance. Seek guidance from your physician and exercise physiologist before implementing an exercise routine and learn your capabilities and proper form for all exercise.  Introduction to Yoga  Clinical staff conducted group or individual video education with verbal and written material and guidebook.  Patient learns about yoga, a discipline of the coming together of mind, breath, and body. The benefits of yoga include improved flexibility, improved range of motion, better posture and core strength, increased lung function, weight loss, and positive self-image. Yoga's heart  health benefits include lowered blood pressure, healthier heart rate, decreased cholesterol and triglyceride levels, improved immune function, and reduced stress. Seek guidance from your physician and exercise physiologist before implementing an exercise routine and learn your capabilities and proper form for all exercise.  Medical   Aging: Enhancing Your  Quality of Life  Clinical staff conducted group or individual video education with verbal and written material and guidebook.  Patient learns key strategies and recommendations to stay in good physical health and enhance quality of life, such as prevention strategies, having an advocate, securing a Health Care Proxy and Power of Attorney, and keeping a list of medications and system for tracking them. It also discusses how to avoid risk for bone loss.  Biology of Weight Control  Clinical staff conducted group or individual video education with verbal and written material and guidebook.  Patient learns that weight gain occurs because we consume more calories than we burn (eating more, moving less). Even if your body weight is normal, you may have higher ratios of fat compared to muscle mass. Too much body fat puts you at increased risk for cardiovascular disease, heart attack, stroke, type 2 diabetes, and obesity-related cancers. In addition to exercise, following the Pritikin Eating Plan can help reduce your risk.  Decoding Lab Results  Clinical staff conducted group or individual video education with verbal and written material and guidebook.  Patient learns that lab test reflects one measurement whose values change over time and are influenced by many factors, including medication, stress, sleep, exercise, food, hydration, pre-existing medical conditions, and more. It is recommended to use the knowledge from this video to become more involved with your lab results and evaluate your numbers to speak with your doctor.   Diseases of Our Time - Overview  Clinical staff conducted group or individual video education with verbal and written material and guidebook.  Patient learns that according to the CDC, 50% to 70% of chronic diseases (such as obesity, type 2 diabetes, elevated lipids, hypertension, and heart disease) are avoidable through lifestyle improvements including healthier food choices,  listening to satiety cues, and increased physical activity.  Sleep Disorders Clinical staff conducted group or individual video education with verbal and written material and guidebook.  Patient learns how good quality and duration of sleep are important to overall health and well-being. Patient also learns about sleep disorders and how they impact health along with recommendations to address them, including discussing with a physician.  Nutrition  Dining Out - Part 2 Clinical staff conducted group or individual video education with verbal and written material and guidebook.  Patient learns how to plan ahead and communicate in order to maximize their dining experience in a healthy and nutritious manner. Included are recommended food choices based on the type of restaurant the patient is visiting.   Fueling a Banker conducted group or individual video education with verbal and written material and guidebook.  There is a strong connection between our food choices and our health. Diseases like obesity and type 2 diabetes are very prevalent and are in large-part due to lifestyle choices. The Pritikin Eating Plan provides plenty of food and hunger-curbing satisfaction. It is easy to follow, affordable, and helps reduce health risks.  Menu Workshop  Clinical staff conducted group or individual video education with verbal and written material and guidebook.  Patient learns that restaurant meals can sabotage health goals because they are often packed with calories, fat, sodium, and sugar. Recommendations include strategies  to plan ahead and to communicate with the manager, chef, or server to help order a healthier meal.  Planning Your Eating Strategy  Clinical staff conducted group or individual video education with verbal and written material and guidebook.  Patient learns about the Pritikin Eating Plan and its benefit of reducing the risk of disease. The Pritikin Eating Plan does  not focus on calories. Instead, it emphasizes high-quality, nutrient-rich foods. By knowing the characteristics of the foods, we choose, we can determine their calorie density and make informed decisions.  Targeting Your Nutrition Priorities  Clinical staff conducted group or individual video education with verbal and written material and guidebook.  Patient learns that lifestyle habits have a tremendous impact on disease risk and progression. This video provides eating and physical activity recommendations based on your personal health goals, such as reducing LDL cholesterol, losing weight, preventing or controlling type 2 diabetes, and reducing high blood pressure.  Vitamins and Minerals  Clinical staff conducted group or individual video education with verbal and written material and guidebook.  Patient learns different ways to obtain key vitamins and minerals, including through a recommended healthy diet. It is important to discuss all supplements you take with your doctor.   Healthy Mind-Set    Smoking Cessation  Clinical staff conducted group or individual video education with verbal and written material and guidebook.  Patient learns that cigarette smoking and tobacco addiction pose a serious health risk which affects millions of people. Stopping smoking will significantly reduce the risk of heart disease, lung disease, and many forms of cancer. Recommended strategies for quitting are covered, including working with your doctor to develop a successful plan.  Culinary   Becoming a Set Designer conducted group or individual video education with verbal and written material and guidebook.  Patient learns that cooking at home can be healthy, cost-effective, quick, and puts them in control. Keys to cooking healthy recipes will include looking at your recipe, assessing your equipment needs, planning ahead, making it simple, choosing cost-effective seasonal ingredients, and  limiting the use of added fats, salts, and sugars.  Cooking - Breakfast and Snacks  Clinical staff conducted group or individual video education with verbal and written material and guidebook.  Patient learns how important breakfast is to satiety and nutrition through the entire day. Recommendations include key foods to eat during breakfast to help stabilize blood sugar levels and to prevent overeating at meals later in the day. Planning ahead is also a key component.  Cooking - Educational Psychologist conducted group or individual video education with verbal and written material and guidebook.  Patient learns eating strategies to improve overall health, including an approach to cook more at home. Recommendations include thinking of animal protein as a side on your plate rather than center stage and focusing instead on lower calorie dense options like vegetables, fruits, whole grains, and plant-based proteins, such as beans. Making sauces in large quantities to freeze for later and leaving the skin on your vegetables are also recommended to maximize your experience.  Cooking - Healthy Salads and Dressing Clinical staff conducted group or individual video education with verbal and written material and guidebook.  Patient learns that vegetables, fruits, whole grains, and legumes are the foundations of the Pritikin Eating Plan. Recommendations include how to incorporate each of these in flavorful and healthy salads, and how to create homemade salad dressings. Proper handling of ingredients is also covered. Cooking - Soups and [employer] -  Soups and Desserts Clinical staff conducted group or individual video education with verbal and written material and guidebook.  Patient learns that Pritikin soups and desserts make for easy, nutritious, and delicious snacks and meal components that are low in sodium, fat, sugar, and calorie density, while high in vitamins, minerals, and filling fiber.  Recommendations include simple and healthy ideas for soups and desserts.   Overview     The Pritikin Solution Program Overview Clinical staff conducted group or individual video education with verbal and written material and guidebook.  Patient learns that the results of the Pritikin Program have been documented in more than 100 articles published in peer-reviewed journals, and the benefits include reducing risk factors for (and, in some cases, even reversing) high cholesterol, high blood pressure, type 2 diabetes, obesity, and more! An overview of the three key pillars of the Pritikin Program will be covered: eating well, doing regular exercise, and having a healthy mind-set.  WORKSHOPS  Exercise: Exercise Basics: Building Your Action Plan Clinical staff led group instruction and group discussion with PowerPoint presentation and patient guidebook. To enhance the learning environment the use of posters, models and videos may be added. At the conclusion of this workshop, patients will comprehend the difference between physical activity and exercise, as well as the benefits of incorporating both, into their routine. Patients will understand the FITT (Frequency, Intensity, Time, and Type) principle and how to use it to build an exercise action plan. In addition, safety concerns and other considerations for exercise and cardiac rehab will be addressed by the presenter. The purpose of this lesson is to promote a comprehensive and effective weekly exercise routine in order to improve patients' overall level of fitness.   Managing Heart Disease: Your Path to a Healthier Heart Clinical staff led group instruction and group discussion with PowerPoint presentation and patient guidebook. To enhance the learning environment the use of posters, models and videos may be added.At the conclusion of this workshop, patients will understand the anatomy and physiology of the heart. Additionally, they will  understand how Pritikin's three pillars impact the risk factors, the progression, and the management of heart disease.  The purpose of this lesson is to provide a high-level overview of the heart, heart disease, and how the Pritikin lifestyle positively impacts risk factors.  Exercise Biomechanics Clinical staff led group instruction and group discussion with PowerPoint presentation and patient guidebook. To enhance the learning environment the use of posters, models and videos may be added. Patients will learn how the structural parts of their bodies function and how these functions impact their daily activities, movement, and exercise. Patients will learn how to promote a neutral spine, learn how to manage pain, and identify ways to improve their physical movement in order to promote healthy living. The purpose of this lesson is to expose patients to common physical limitations that impact physical activity. Participants will learn practical ways to adapt and manage aches and pains, and to minimize their effect on regular exercise. Patients will learn how to maintain good posture while sitting, walking, and lifting.  Balance Training and Fall Prevention  Clinical staff led group instruction and group discussion with PowerPoint presentation and patient guidebook. To enhance the learning environment the use of posters, models and videos may be added. At the conclusion of this workshop, patients will understand the importance of their sensorimotor skills (vision, proprioception, and the vestibular system) in maintaining their ability to balance as they age. Patients will apply a variety of balancing  exercises that are appropriate for their current level of function. Patients will understand the common causes for poor balance, possible solutions to these problems, and ways to modify their physical environment in order to minimize their fall risk. The purpose of this lesson is to teach patients  about the importance of maintaining balance as they age and ways to minimize their risk of falling.  WORKSHOPS   Nutrition:  Fueling a Ship Broker led group instruction and group discussion with PowerPoint presentation and patient guidebook. To enhance the learning environment the use of posters, models and videos may be added. Patients will review the foundational principles of the Pritikin Eating Plan and understand what constitutes a serving size in each of the food groups. Patients will also learn Pritikin-friendly foods that are better choices when away from home and review make-ahead meal and snack options. Calorie density will be reviewed and applied to three nutrition priorities: weight maintenance, weight loss, and weight gain. The purpose of this lesson is to reinforce (in a group setting) the key concepts around what patients are recommended to eat and how to apply these guidelines when away from home by planning and selecting Pritikin-friendly options. Patients will understand how calorie density may be adjusted for different weight management goals.  Mindful Eating  Clinical staff led group instruction and group discussion with PowerPoint presentation and patient guidebook. To enhance the learning environment the use of posters, models and videos may be added. Patients will briefly review the concepts of the Pritikin Eating Plan and the importance of low-calorie dense foods. The concept of mindful eating will be introduced as well as the importance of paying attention to internal hunger signals. Triggers for non-hunger eating and techniques for dealing with triggers will be explored. The purpose of this lesson is to provide patients with the opportunity to review the basic principles of the Pritikin Eating Plan, discuss the value of eating mindfully and how to measure internal cues of hunger and fullness using the Hunger Scale. Patients will also discuss reasons for non-hunger  eating and learn strategies to use for controlling emotional eating.  Targeting Your Nutrition Priorities Clinical staff led group instruction and group discussion with PowerPoint presentation and patient guidebook. To enhance the learning environment the use of posters, models and videos may be added. Patients will learn how to determine their genetic susceptibility to disease by reviewing their family history. Patients will gain insight into the importance of diet as part of an overall healthy lifestyle in mitigating the impact of genetics and other environmental insults. The purpose of this lesson is to provide patients with the opportunity to assess their personal nutrition priorities by looking at their family history, their own health history and current risk factors. Patients will also be able to discuss ways of prioritizing and modifying the Pritikin Eating Plan for their highest risk areas  Menu  Clinical staff led group instruction and group discussion with PowerPoint presentation and patient guidebook. To enhance the learning environment the use of posters, models and videos may be added. Using menus brought in from [employer], or printed from [employer], patients will apply the Pritikin dining out guidelines that were presented in the [employer] video. Patients will also be able to practice these guidelines in a variety of provided scenarios. The purpose of this lesson is to provide patients with the opportunity to practice hands-on learning of the Pritikin Dining Out guidelines with actual menus and practice scenarios.  Label Reading  Clinical staff led group instruction and group discussion with PowerPoint presentation and patient guidebook. To enhance the learning environment the use of posters, models and videos may be added. Patients will review and discuss the Pritikin label reading guidelines presented in Pritikin's Label Reading Educational series video.  Using fool labels brought in from local grocery stores and markets, patients will apply the label reading guidelines and determine if the packaged food meet the Pritikin guidelines. The purpose of this lesson is to provide patients with the opportunity to review, discuss, and practice hands-on learning of the Pritikin Label Reading guidelines with actual packaged food labels. Cooking School  Pritikin's [employer] are designed to teach patients ways to prepare quick, simple, and affordable recipes at home. The importance of nutrition's role in chronic disease risk reduction is reflected in its emphasis in the overall Pritikin program. By learning how to prepare essential core Pritikin Eating Plan recipes, patients will increase control over what they eat; be able to customize the flavor of foods without the use of added salt, sugar, or fat; and improve the quality of the food they consume. By learning a set of core recipes which are easily assembled, quickly prepared, and affordable, patients are more likely to prepare more healthy foods at home. These workshops focus on convenient breakfasts, simple entres, side dishes, and desserts which can be prepared with minimal effort and are consistent with nutrition recommendations for cardiovascular risk reduction. Cooking [employer] are taught by a armed forces logistics/support/administrative officer (RD) who has been trained by the [employer]. The chef or RD has a clear understanding of the importance of minimizing - if not completely eliminating - added fat, sugar, and sodium in recipes. Throughout the series of Cooking School Workshop sessions, patients will learn about healthy ingredients and efficient methods of cooking to build confidence in their capability to prepare    Cooking School weekly topics:  Adding Flavor- Sodium-Free  Fast and Healthy Breakfasts  Powerhouse Plant-Based Proteins  Satisfying Salads and Dressings  Simple Sides and  Sauces  International Cuisine-Spotlight on the [employer] Zones  Delicious Desserts  Savory Soups  [employer] - Meals in a Astronomer Appetizers and Snacks  Comforting Weekend Breakfasts  One-Pot Wonders   Fast Evening Meals  Landscape Architect Your Pritikin Plate  WORKSHOPS   Healthy Mindset (Psychosocial):  Focused Goals, Sustainable Changes Clinical staff led group instruction and group discussion with PowerPoint presentation and patient guidebook. To enhance the learning environment the use of posters, models and videos may be added. Patients will be able to apply effective goal setting strategies to establish at least one personal goal, and then take consistent, meaningful action toward that goal. They will learn to identify common barriers to achieving personal goals and develop strategies to overcome them. Patients will also gain an understanding of how our mind-set can impact our ability to achieve goals and the importance of cultivating a positive and growth-oriented mind-set. The purpose of this lesson is to provide patients with a deeper understanding of how to set and achieve personal goals, as well as the tools and strategies needed to overcome common obstacles which may arise along the way.  From Head to Heart: The Power of a Healthy Outlook  Clinical staff led group instruction and group discussion with PowerPoint presentation and patient guidebook. To enhance the learning environment the use of posters, models and videos may be added. Patients will be able to recognize and describe the  impact of emotions and mood on physical health. They will discover the importance of self-care and explore self-care practices which may work for them. Patients will also learn how to utilize the 4 C's to cultivate a healthier outlook and better manage stress and challenges. The purpose of this lesson is to demonstrate to patients how a healthy outlook is an essential part of  maintaining good health, especially as they continue their cardiac rehab journey.  Healthy Sleep for a Healthy Heart Clinical staff led group instruction and group discussion with PowerPoint presentation and patient guidebook. To enhance the learning environment the use of posters, models and videos may be added. At the conclusion of this workshop, patients will be able to demonstrate knowledge of the importance of sleep to overall health, well-being, and quality of life. They will understand the symptoms of, and treatments for, common sleep disorders. Patients will also be able to identify daytime and nighttime behaviors which impact sleep, and they will be able to apply these tools to help manage sleep-related challenges. The purpose of this lesson is to provide patients with a general overview of sleep and outline the importance of quality sleep. Patients will learn about a few of the most common sleep disorders. Patients will also be introduced to the concept of "sleep hygiene," and discover ways to self-manage certain sleeping problems through simple daily behavior changes. Finally, the workshop will motivate patients by clarifying the links between quality sleep and their goals of heart-healthy living.   Recognizing and Reducing Stress Clinical staff led group instruction and group discussion with PowerPoint presentation and patient guidebook. To enhance the learning environment the use of posters, models and videos may be added. At the conclusion of this workshop, patients will be able to understand the types of stress reactions, differentiate between acute and chronic stress, and recognize the impact that chronic stress has on their health. They will also be able to apply different coping mechanisms, such as reframing negative self-talk. Patients will have the opportunity to practice a variety of stress management techniques, such as deep abdominal breathing, progressive muscle relaxation, and/or  guided imagery.  The purpose of this lesson is to educate patients on the role of stress in their lives and to provide healthy techniques for coping with it.  Learning Barriers/Preferences:  Learning Barriers/Preferences - 10/12/24 1652       Learning Barriers/Preferences   Learning Barriers None    Learning Preferences Skilled Demonstration;Pictoral;Group Instruction;Individual Instruction;Written Material;Video          Education Topics:  Knowledge Questionnaire Score:  Knowledge Questionnaire Score - 10/12/24 1652       Knowledge Questionnaire Score   Pre Score 22/24          Core Components/Risk Factors/Patient Goals at Admission:  Personal Goals and Risk Factors at Admission - 10/12/24 1654       Core Components/Risk Factors/Patient Goals on Admission   Diabetes Yes    Intervention Provide education about signs/symptoms and action to take for hypo/hyperglycemia.;Provide education about proper nutrition, including hydration, and aerobic/resistive exercise prescription along with prescribed medications to achieve blood glucose in normal ranges: Fasting glucose 65-99 mg/dL    Expected Outcomes Short Term: Participant verbalizes understanding of the signs/symptoms and immediate care of hyper/hypoglycemia, proper foot care and importance of medication, aerobic/resistive exercise and nutrition plan for blood glucose control.;Long Term: Attainment of HbA1C < 7%.    Hypertension Yes    Intervention Provide education on lifestyle modifcations including regular physical activity/exercise, weight management,  moderate sodium restriction and increased consumption of fresh fruit, vegetables, and low fat dairy, alcohol moderation, and smoking cessation.;Monitor prescription use compliance.    Expected Outcomes Short Term: Continued assessment and intervention until BP is < 140/72mm HG in hypertensive participants. < 130/85mm HG in hypertensive participants with diabetes, heart failure or  chronic kidney disease.;Long Term: Maintenance of blood pressure at goal levels.    Lipids Yes    Intervention Provide education and support for participant on nutrition & aerobic/resistive exercise along with prescribed medications to achieve LDL 70mg , HDL >40mg .    Expected Outcomes Short Term: Participant states understanding of desired cholesterol values and is compliant with medications prescribed. Participant is following exercise prescription and nutrition guidelines.;Long Term: Cholesterol controlled with medications as prescribed, with individualized exercise RX and with personalized nutrition plan. Value goals: LDL < 70mg , HDL > 40 mg.    Stress Yes    Intervention Offer individual and/or small group education and counseling on adjustment to heart disease, stress management and health-related lifestyle change. Teach and support self-help strategies.;Refer participants experiencing significant psychosocial distress to appropriate mental health specialists for further evaluation and treatment. When possible, include family members and significant others in education/counseling sessions.    Expected Outcomes Short Term: Participant demonstrates changes in health-related behavior, relaxation and other stress management skills, ability to obtain effective social support, and compliance with psychotropic medications if prescribed.;Long Term: Emotional wellbeing is indicated by absence of clinically significant psychosocial distress or social isolation.    Personal Goal Other Yes    Personal Goal ST:UB strength, ROM with sternal precautions LT: tune up for travel, endurance    Intervention Will continue to monitor pt and progress WL's as tolerated without sign or symptom    Expected Outcomes Pt will achieve her goals          Core Components/Risk Factors/Patient Goals Review:    Core Components/Risk Factors/Patient Goals at Discharge (Final Review):    ITP Comments:  ITP Comments     Row  Name 10/12/24 1325           ITP Comments Wilbert Bihari, MD: Medical Director. Introduction to the Pritikin Education Program/Intensive Cardiac Rehab. Initial orientation packet reviewed with patient.          Comments: Participant attended orientation for the cardiac rehabilitation program on  10/12/2024  to perform initial intake and exercise walk test. Patient introduced to the Pritikin Program education and orientation packet was reviewed. Completed 6-minute walk test, measurements, initial ITP, and exercise prescription. Vital signs stable. Telemetry mostly junctional, some NSR, asymptomatic.   Service time was from 1315 to 1530.

## 2024-10-12 NOTE — Progress Notes (Signed)
 Cardiac Rehab Medication Review   Does the patient  feel that his/her medications are working for him/her?  yes  Has the patient been experiencing any side effects to the medications prescribed?  no  Does the patient measure his/her own blood pressure or blood glucose at home?  yes   Does the patient have any problems obtaining medications due to transportation or finances?   no  Understanding of regimen: good Understanding of indications: good Potential of compliance: good    Comments: Patient has good understanding of her medications. She has a BP cuff at home and checks 3 times a week. She does not check her glucose at home and says she does not have a meter. Will work with nurse soon about updating medications in EPIC    Alec GORMAN Finder 10/12/2024 4:56 PM

## 2024-10-16 ENCOUNTER — Encounter (HOSPITAL_COMMUNITY)
Admission: RE | Admit: 2024-10-16 | Discharge: 2024-10-16 | Disposition: A | Source: Ambulatory Visit | Attending: Cardiology

## 2024-10-16 DIAGNOSIS — Z952 Presence of prosthetic heart valve: Secondary | ICD-10-CM | POA: Diagnosis present

## 2024-10-16 NOTE — Progress Notes (Signed)
 Daily Session Note  Patient Details  Name: Natasha Mcknight MRN: [id number] Date of Birth: Nov 16, 1965 Referring Provider:   Flowsheet Row INTENSIVE CARDIAC REHAB ORIENT from 10/12/2024 in [facility]  Referring Provider Dr. Emeline (Wilbert Bihari, MD covering)    Encounter Date: 10/16/2024  Check In:  Session Check In - 10/16/24 1244       Check-In   Supervising physician immediately available to respond to emergencies [facility] MD immediately available    Physician(s) Rosaline Bane, NP    Location [facility]    Staff Present Alec Finder BS, ACSM-CEP, Exercise Physiologist;Maria Whitaker, RN, Avonne Gal, MS, ACSM-CEP, Exercise Physiologist;David Janann, MS, ACSM-CEP, CCRP, Exercise Physiologist;Sharlon Pfohl Lennon, RN, BSN    Virtual Visit No    Medication changes reported     No    Fall or balance concerns reported    No    Tobacco Cessation No Change    Warm-up and Cool-down Performed as group-led instruction    Resistance Training Performed Yes    VAD Patient? No    PAD/SET Patient? No      Pain Assessment   Currently in Pain? No/denies    Multiple Pain Sites No          Capillary Blood Glucose: No results found for this or any previous visit (from the past 24 hours).   Exercise Prescription Changes - 10/16/24 1400       Response to Exercise   Blood Pressure (Admit) 124/64    Blood Pressure (Exercise) 130/70    Blood Pressure (Exit) 130/74    Heart Rate (Admit) 101 bpm    Heart Rate (Exercise) 122 bpm    Heart Rate (Exit) 101 bpm    Rating of Perceived Exertion (Exercise) 11    Symptoms None    Comments Pt's first day in the [facility]2 program    Duration Continue with 30 min of aerobic exercise without signs/symptoms of physical distress.    Intensity THRR unchanged      Progression   Progression Continue to progress workloads to maintain intensity without signs/symptoms of physical  distress.    Average METs 2.25      Resistance Training   Training Prescription Yes    Weight 2 lbs    Reps 10-15    Time 5 Minutes      Interval Training   Interval Training No      Treadmill   MPH 2.2    Grade 0    Minutes 15    METs 2.69      NuStep   Level 1    SPM 81    Minutes 15    METs 1.8          Social History   Tobacco Use  Smoking Status Never  Smokeless Tobacco Not on file    Goals Met:  Exercise tolerated well No report of concerns or symptoms today Strength training completed today  Goals Unmet:  Not Applicable  Comments: Pt started cardiac rehab today.  Pt tolerated light exercise without difficulty. VSS, telemetry-accelerated junctional rhythm, asymptomatic.  Medication list reconciled. Pt denies barriers to medication compliance.  PSYCHOSOCIAL ASSESSMENT:  PHQ-3. Pt exhibits positive coping skills, hopeful outlook with supportive family. No psychosocial needs identified at this time, no psychosocial interventions necessary.  Pt oriented to exercise equipment and routine.    Understanding verbalized.     Dr. Wilbert Bihari is Medical Director for Cardiac Rehab at  [facility].

## 2024-10-18 ENCOUNTER — Ambulatory Visit: Payer: Self-pay | Admitting: Cardiology

## 2024-10-18 ENCOUNTER — Encounter (HOSPITAL_COMMUNITY)
Admission: RE | Admit: 2024-10-18 | Discharge: 2024-10-18 | Disposition: A | Discharge status: Home / Self Care | Source: Ambulatory Visit | Attending: Cardiology | Admitting: Cardiology

## 2024-10-18 DIAGNOSIS — Z952 Presence of prosthetic heart valve: Secondary | ICD-10-CM | POA: Diagnosis not present

## 2024-10-18 LAB — GLUCOSE, CAPILLARY
Glucose-Capillary: 138 mg/dL — ABNORMAL HIGH (ref 70–99)
Glucose-Capillary: 103 mg/dL — ABNORMAL HIGH (ref 70–99)
Glucose-Capillary: 74 mg/dL (ref 70–99)

## 2024-10-20 ENCOUNTER — Encounter (HOSPITAL_COMMUNITY)
Admission: RE | Admit: 2024-10-20 | Discharge: 2024-10-20 | Disposition: A | Source: Ambulatory Visit | Attending: Cardiology

## 2024-10-20 DIAGNOSIS — Z952 Presence of prosthetic heart valve: Secondary | ICD-10-CM | POA: Diagnosis not present

## 2024-10-20 NOTE — Progress Notes (Signed)
 Pt had an episode of SVT during exercise at cardiac rehab.  Asymptomatic with no complaints of dizziness, lightheadedness, SOB.  Pt has history of accelerated junctional rhythm.  Provider made aware.  Will continue to monitor pt.

## 2024-10-23 ENCOUNTER — Encounter (HOSPITAL_COMMUNITY)
Admission: RE | Admit: 2024-10-23 | Discharge: 2024-10-23 | Disposition: A | Source: Ambulatory Visit | Attending: Cardiology

## 2024-10-23 DIAGNOSIS — Z952 Presence of prosthetic heart valve: Secondary | ICD-10-CM

## 2024-10-25 ENCOUNTER — Encounter (HOSPITAL_COMMUNITY): Admission: RE | Admit: 2024-10-25 | Discharge: 2024-10-25 | Attending: Cardiology

## 2024-10-25 DIAGNOSIS — Z952 Presence of prosthetic heart valve: Secondary | ICD-10-CM

## 2024-10-27 ENCOUNTER — Encounter (HOSPITAL_COMMUNITY)

## 2024-10-27 DIAGNOSIS — Z952 Presence of prosthetic heart valve: Secondary | ICD-10-CM

## 2024-10-30 ENCOUNTER — Encounter (HOSPITAL_COMMUNITY): Admission: RE | Admit: 2024-10-30 | Discharge: 2024-10-30 | Attending: Cardiology

## 2024-10-30 DIAGNOSIS — Z952 Presence of prosthetic heart valve: Secondary | ICD-10-CM

## 2024-11-01 ENCOUNTER — Encounter (HOSPITAL_COMMUNITY): Admission: RE | Admit: 2024-11-01 | Source: Ambulatory Visit

## 2024-11-06 ENCOUNTER — Encounter (HOSPITAL_COMMUNITY): Admission: RE | Admit: 2024-11-06 | Source: Ambulatory Visit

## 2024-11-07 NOTE — Progress Notes (Signed)
 Cardiac Individual Treatment Plan  Patient Details  Name: Natasha Mcknight MRN: [id number] Date of Birth: [birth year] Referring Provider:   Flowsheet Row INTENSIVE CARDIAC REHAB ORIENT from 10/12/2024 in [facility] for Heart, Vascular, & Lung Health  Referring Provider Dr. Emeline (Wilbert Bihari, MD covering)    Initial Encounter Date:  Flowsheet Row INTENSIVE CARDIAC REHAB ORIENT from 10/12/2024 in [facility] for Heart, Vascular, & Lung [facility]  Date 10/12/24    Visit Diagnosis: S/P mitral valve replacement  S/P AVR (aortic valve replacement)  Patient's Home Medications on Admission: Current Medications[1]  Past Medical History: Past Medical History:  Diagnosis Date   Anemia    Clotting disorder    Endometriosis    Fibroids    GERD (gastroesophageal reflux disease)    Murmur, cardiac     Tobacco Use: Tobacco Use History[2]  Labs: Review Flowsheet        No data to display          Capillary Blood Glucose: Lab Results  Component Value Date   GLUCAP 103 (H) 10/18/2024   GLUCAP 74 10/18/2024   GLUCAP 138 (H) 10/18/2024   GLUCAP 87 10/12/2024     Exercise Target Goals: Exercise Program Goal: Individual exercise prescription set using results from initial 6 min walk test and THRR while considering  patients activity barriers and safety.   Exercise Prescription Goal: Initial exercise prescription builds to 30-45 minutes a day of aerobic activity, 2-3 days per week.  Home exercise guidelines will be given to patient during program as part of exercise prescription that the participant will acknowledge.  Activity Barriers & Risk Stratification:  Activity Barriers & Cardiac Risk Stratification - 10/12/24 1647       Activity Barriers & Cardiac Risk Stratification   Activity Barriers Deconditioning;Back Problems    Cardiac Risk Stratification High   Under 5 MEt's on walk test         6 Minute Walk:  6  Minute Walk     Row Name 10/12/24 1636         6 Minute Walk   Phase Initial     Distance 1155 feet     Walk Time 6 minutes     # of Rest Breaks 0     MPH 2.19     METS 3.53     RPE 9     Perceived Dyspnea  0     VO2 Peak 12.34     Symptoms Yes (comment)     Comments last lap 1 second stink feeling pain in chest. Resolved instant, none additional. She says maybe 1/10 pain just enough to notice. Nurse aware. No over symptom     Resting HR 97 bpm     Resting BP 138/72     Resting Oxygen Saturation  99 %     Exercise Oxygen Saturation  during 6 min walk 100 %     Max Ex. HR 109 bpm     Max Ex. BP 144/80     2 Minute Post BP 138/80        Oxygen Initial Assessment:   Oxygen Re-Evaluation:   Oxygen Discharge (Final Oxygen Re-Evaluation):   Initial Exercise Prescription:  Initial Exercise Prescription - 10/12/24 1300       Date of Initial Exercise RX and Referring Provider   Date 10/12/24    Referring Provider Dr. Emeline Earley Bihari, MD covering)    Expected Discharge Date 01/03/25  Treadmill   MPH 2.4    Grade 0    Minutes 15    METs 2.84      NuStep   Level 1    SPM 70    Minutes 1.8      Prescription Details   Frequency (times per week) 3    Duration Progress to 30 minutes of continuous aerobic without signs/symptoms of physical distress      Intensity   THRR 40-80% of Max Heartrate 65-130    Ratings of Perceived Exertion 11-13    Perceived Dyspnea 0-4      Progression   Progression Continue progressive overload as per policy without signs/symptoms or physical distress.      Resistance Training   Training Prescription Yes    Weight 2    Reps 10-15          Perform Capillary Blood Glucose checks as needed.  Exercise Prescription Changes:   Exercise Prescription Changes     Row Name 10/16/24 1400 10/30/24 1500           Response to Exercise   Blood Pressure (Admit) 124/64 120/58      Blood Pressure (Exercise) 130/70 118/70       Blood Pressure (Exit) 130/74 110/52      Heart Rate (Admit) 101 bpm 105 bpm      Heart Rate (Exercise) 122 bpm 106 bpm      Heart Rate (Exit) 101 bpm 99 bpm      Rating of Perceived Exertion (Exercise) 11 11      Symptoms None None      Comments Pt's first day in the CRP2 program Reviewed METs      Duration Continue with 30 min of aerobic exercise without signs/symptoms of physical distress. Continue with 30 min of aerobic exercise without signs/symptoms of physical distress.      Intensity THRR unchanged THRR unchanged        Progression   Progression Continue to progress workloads to maintain intensity without signs/symptoms of physical distress. Continue to progress workloads to maintain intensity without signs/symptoms of physical distress.      Average METs 2.25 2.35        Resistance Training   Training Prescription Yes --      Weight 2 lbs 2 lbs      Reps 10-15 10-15      Time 5 Minutes 5 Minutes        Interval Training   Interval Training No No        Treadmill   MPH 2.2 2.2      Grade 0 0      Minutes 15 15      METs 2.69 2.69        NuStep   Level 1 1      SPM 81 103      Minutes 15 15      METs 1.8 2         Exercise Comments:   Exercise Comments     Row Name 10/16/24 1423 10/30/24 1549         Exercise Comments Pt's first day in the CRP2 program; pt exercised without complaints. Reviewed METs with patient today. METs are steady at 2.7. Will increase workload on treadmill next session.         Exercise Goals and Review:   Exercise Goals     Row Name 10/12/24 1325  Exercise Goals   Increase Physical Activity Yes       Intervention Provide advice, education, support and counseling about physical activity/exercise needs.;Develop an individualized exercise prescription for aerobic and resistive training based on initial evaluation findings, risk stratification, comorbidities and participant's personal goals.       Expected Outcomes  Short Term: Attend rehab on a regular basis to increase amount of physical activity.;Long Term: Exercising regularly at least 3-5 days a week.;Long Term: Add in home exercise to make exercise part of routine and to increase amount of physical activity.       Increase Strength and Stamina Yes       Intervention Provide advice, education, support and counseling about physical activity/exercise needs.;Develop an individualized exercise prescription for aerobic and resistive training based on initial evaluation findings, risk stratification, comorbidities and participant's personal goals.       Expected Outcomes Short Term: Increase workloads from initial exercise prescription for resistance, speed, and METs.;Short Term: Perform resistance training exercises routinely during rehab and add in resistance training at home;Long Term: Improve cardiorespiratory fitness, muscular endurance and strength as measured by increased METs and functional capacity ( )       Able to understand and use rate of perceived exertion (RPE) scale Yes       Intervention Provide education and explanation on how to use RPE scale       Expected Outcomes Short Term: Able to use RPE daily in rehab to express subjective intensity level;Long Term:  Able to use RPE to guide intensity level when exercising independently       Knowledge and understanding of Target Heart Rate Range (THRR) Yes       Intervention Provide education and explanation of THRR including how the numbers were predicted and where they are located for reference       Expected Outcomes Short Term: Able to state/look up THRR;Long Term: Able to use THRR to govern intensity when exercising independently;Short Term: Able to use daily as guideline for intensity in rehab       Understanding of Exercise Prescription Yes       Intervention Provide education, explanation, and written materials on patient's individual exercise prescription       Expected Outcomes Short Term: Able  to explain program exercise prescription;Long Term: Able to explain home exercise prescription to exercise independently          Exercise Goals Re-Evaluation :  Exercise Goals Re-Evaluation     Row Name 10/16/24 1422             Exercise Goal Re-Evaluation   Exercise Goals Review Increase Physical Activity;Understanding of Exercise Prescription;Knowledge and understanding of Target Heart Rate Range (THRR);Increase Strength and Stamina;Able to understand and use rate of perceived exertion (RPE) scale       Comments Pt's first day in the CRP2 program. Pt understands the exercie Rx, RPE scale, and THRR.       Expected Outcomes Will continue to monitor the patient and progress exercise workloads as tolerated.          Discharge Exercise Prescription (Final Exercise Prescription Changes):  Exercise Prescription Changes - 10/30/24 1500       Response to Exercise   Blood Pressure (Admit) 120/58    Blood Pressure (Exercise) 118/70    Blood Pressure (Exit) 110/52    Heart Rate (Admit) 105 bpm    Heart Rate (Exercise) 106 bpm    Heart Rate (Exit) 99 bpm    Rating of Perceived  Exertion (Exercise) 11    Symptoms None    Comments Reviewed METs    Duration Continue with 30 min of aerobic exercise without signs/symptoms of physical distress.    Intensity THRR unchanged      Progression   Progression Continue to progress workloads to maintain intensity without signs/symptoms of physical distress.    Average METs 2.35      Resistance Training   Weight 2 lbs    Reps 10-15    Time 5 Minutes      Interval Training   Interval Training No      Treadmill   MPH 2.2    Grade 0    Minutes 15    METs 2.69      NuStep   Level 1    SPM 103    Minutes 15    METs 2          Nutrition:  Target Goals: Understanding of nutrition guidelines, daily intake of sodium 1500mg , cholesterol 200mg , calories 30% from fat and 7% or less from saturated fats, daily to have 5 or more servings of  fruits and vegetables.  Biometrics:  Pre Biometrics - 10/12/24 1649       Pre Biometrics   Height 5' 4 (1.626 m)    Weight 68.3 kg    Waist Circumference 35.5 inches    Hip Circumference 36 inches    Waist to Hip Ratio 0.99 %    BMI (Calculated) 25.83    Triceps Skinfold 7 mm    % Body Fat 30.4 %    Grip Strength 16 kg    Flexibility 11.5 in    Single Leg Stand 30 seconds           Nutrition Therapy Plan and Nutrition Goals:  Nutrition Therapy & Goals - 10/16/24 1330       Nutrition Therapy   Diet Heart Healthy    Drug/Food Interactions Coumadin/Vit K      Personal Nutrition Goals   Nutrition Goal Patient to identify strategies for reducing cardiovascular risk by attending the Pritikin education and nutrition series weekly.    Personal Goal #2 Patient to improve diet quality by using the plate method as a guide for meal planning to include lean protein/plant protein, fruits, vegetables, whole grains, nonfat dairy as part of a well-balanced diet.    Comments Patient with cardiac history of idiopathic cardiomyopathy, essential hypertension, severe MR (s/p MV repair with ring and chordal transposition 2010), aortic valve stenosis and aortic regurgitation (s/p mechanical AVR/MVR on 08/08/24). Pt reports limiting vitamin K-rich foods, particulary leafy greens. Verbalizes uncertainty regarding inclusion of heart healthy oils. RD encouraged pt to opt for plant-oils such as canola, olive and canola oil. Discussed need to limit oil intake due to high caloric density. RD also discussed importance of consistent vitamin K intake to help stabilize INR. Patient will benefit from participation in intensive cardiac rehab for nutrition education, exercise, and lifestyle modification.      Intervention Plan   Intervention Prescribe, educate and counsel regarding individualized specific dietary modifications aiming towards targeted core components such as weight, hypertension, lipid management,  diabetes, heart failure and other comorbidities.    Expected Outcomes Short Term Goal: Understand basic principles of dietary content, such as calories, fat, sodium, cholesterol and nutrients.;Long Term Goal: Adherence to prescribed nutrition plan.          Nutrition Assessments:  MEDIFICTS Score Key: >=70 Need to make dietary changes  40-70 Heart Healthy Diet <=  40 Therapeutic Level Cholesterol Diet   Flowsheet Row INTENSIVE CARDIAC REHAB from 10/16/2024 in [facility] for Heart, Vascular, & Lung Health  Picture Your Plate Total Score on Admission 52   Picture Your Plate Scores: <59 Unhealthy dietary pattern with much room for improvement. 41-50 Dietary pattern unlikely to meet recommendations for good health and room for improvement. 51-60 More healthful dietary pattern, with some room for improvement.  >60 Healthy dietary pattern, although there may be some specific behaviors that could be improved.    Nutrition Goals Re-Evaluation:   Nutrition Goals Re-Evaluation:   Nutrition Goals Discharge (Final Nutrition Goals Re-Evaluation):   Psychosocial: Target Goals: Acknowledge presence or absence of significant depression and/or stress, maximize coping skills, provide positive support system. Participant is able to verbalize types and ability to use techniques and skills needed for reducing stress and depression.  Initial Review & Psychosocial Screening:  Initial Psych Review & Screening - 10/12/24 1650       Initial Review   Current issues with Current Stress Concerns    Source of Stress Concerns Financial    Comments She has some mild stress about not working and her finances, but returns to work in 3 weeks      Family Dynamics   Good Support System? Yes      Barriers   Psychosocial barriers to participate in program The patient should benefit from training in stress management and relaxation.      Screening Interventions   Interventions  Encouraged to exercise;Provide feedback about the scores to participant    Expected Outcomes Long Term Goal: Stressors or current issues are controlled or eliminated.;Short Term goal: Identification and review with participant of any Quality of Life or Depression concerns found by scoring the questionnaire.;Long Term goal: The participant improves quality of Life and PHQ9 Scores as seen by post scores and/or verbalization of changes          Quality of Life Scores:  Quality of Life - 10/12/24 1651       Quality of Life   Select Quality of Life      Quality of Life Scores   Health/Function Pre 25.2 %    Socioeconomic Pre 29.06 %    Psych/Spiritual Pre 30 %    Family Pre 25.2 %    GLOBAL Pre 27.04 %         Scores of 19 and below usually indicate a poorer quality of life in these areas.  A difference of  2-3 points is a clinically meaningful difference.  A difference of 2-3 points in the total score of the Quality of Life Index has been associated with significant improvement in overall quality of life, self-image, physical symptoms, and general health in studies assessing change in quality of life.  PHQ-9: Review Flowsheet       10/12/2024  Depression screen PHQ 2/9  Decreased Interest 1  Down, Depressed, Hopeless 0  PHQ - 2 Score 1  Altered sleeping 0  Tired, decreased energy 1  Change in appetite 1  Feeling bad or failure about yourself  0  Trouble concentrating 0  Moving slowly or fidgety/restless 0  Suicidal thoughts 0  PHQ-9 Score 3  Difficult doing work/chores Not difficult at all   Interpretation of Total Score  Total Score Depression Severity:  1-4 = Minimal depression, 5-9 = Mild depression, 10-14 = Moderate depression, 15-19 = Moderately severe depression, 20-27 = Severe depression   Psychosocial Evaluation and Intervention:  Psychosocial Re-Evaluation:  Psychosocial Re-Evaluation     Row Name 11/07/24 1419             Psychosocial Re-Evaluation    Current issues with Current Stress Concerns       Comments Jessye has not voiced any additional psychosocial concerns or stressors during exercise at cardiac rehab       Expected Outcomes Natacia will experience controlled or decreased psychosocial concerns or stressors by completion of cardiac rehab       Interventions Stress management education;Encouraged to attend Cardiac Rehabilitation for the exercise;Relaxation education       Continue Psychosocial Services  No Follow up required          Psychosocial Discharge (Final Psychosocial Re-Evaluation):  Psychosocial Re-Evaluation - 11/07/24 1419       Psychosocial Re-Evaluation   Current issues with Current Stress Concerns    Comments Versa has not voiced any additional psychosocial concerns or stressors during exercise at cardiac rehab    Expected Outcomes Jeanetta will experience controlled or decreased psychosocial concerns or stressors by completion of cardiac rehab    Interventions Stress management education;Encouraged to attend Cardiac Rehabilitation for the exercise;Relaxation education    Continue Psychosocial Services  No Follow up required          Vocational Rehabilitation: Provide vocational rehab assistance to qualifying candidates.   Vocational Rehab Evaluation & Intervention:   Education: Education Goals: Education classes will be provided on a weekly basis, covering required topics. Participant will state understanding/return demonstration of topics presented.    Education     Row Name 10/16/24 1300     Education   Cardiac Education Topics Pritikin   Psychologist, Forensic Exercise Education   Exercise Education Biomechanial Limitations   Instruction Review Code 1- [employer] Understanding    Row Name 10/18/24 1300     Education   Cardiac Education Topics Pritikin   Orthoptist   Educator Dietitian    Weekly Topic Fast Evening Meals   Instruction Review Code 1- Verbalizes Understanding   Class Start Time 1148   Class Stop Time 1232   Class Time Calculation (min) 44 min    Row Name 10/23/24 1300     Education   Cardiac Education Topics Pritikin   Psychologist, Forensic Exercise Education   Exercise Education Improving Performance   Instruction Review Code 1- Verbalizes Understanding   Class Start Time 1400   Class Stop Time 1443   Class Time Calculation (min) 43 min    Row Name 10/25/24 1300     Education   Cardiac Education Topics Pritikin   Customer Service Manager   Weekly Topic International Cuisine- Spotlight on the [employer] Zones   Instruction Review Code 1- Verbalizes Understanding   Class Start Time 1400   Class Stop Time 1437   Class Time Calculation (min) 37 min      Core Videos: Exercise    Move It!  Clinical staff conducted group or individual video education with verbal and written material and guidebook.  Patient learns the recommended Pritikin exercise program. Exercise with the goal of living a long, healthy life. Some of the health benefits of exercise include controlled diabetes, healthier blood pressure levels, improved cholesterol levels,  improved heart and lung capacity, improved sleep, and better body composition. Everyone should speak with their doctor before starting or changing an exercise routine.  Biomechanical Limitations Clinical staff conducted group or individual video education with verbal and written material and guidebook.  Patient learns how biomechanical limitations can impact exercise and how we can mitigate and possibly overcome limitations to have an impactful and balanced exercise routine.  Body Composition Clinical staff conducted group or individual video education with verbal and written material and guidebook.  Patient learns that body  composition (ratio of muscle mass to fat mass) is a key component to assessing overall fitness, rather than body weight alone. Increased fat mass, especially visceral belly fat, can put us  at increased risk for metabolic syndrome, type 2 diabetes, heart disease, and even death. It is recommended to combine diet and exercise (cardiovascular and resistance training) to improve your body composition. Seek guidance from your physician and exercise physiologist before implementing an exercise routine.  Exercise Action Plan Clinical staff conducted group or individual video education with verbal and written material and guidebook.  Patient learns the recommended strategies to achieve and enjoy long-term exercise adherence, including variety, self-motivation, self-efficacy, and positive decision making. Benefits of exercise include fitness, good health, weight management, more energy, better sleep, less stress, and overall well-being.  Medical   Heart Disease Risk Reduction Clinical staff conducted group or individual video education with verbal and written material and guidebook.  Patient learns our heart is our most vital organ as it circulates oxygen, nutrients, white blood cells, and hormones throughout the entire body, and carries waste away. Data supports a plant-based eating plan like the Pritikin Program for its effectiveness in slowing progression of and reversing heart disease. The video provides a number of recommendations to address heart disease.   Metabolic Syndrome and Belly Fat  Clinical staff conducted group or individual video education with verbal and written material and guidebook.  Patient learns what metabolic syndrome is, how it leads to heart disease, and how one can reverse it and keep it from coming back. You have metabolic syndrome if you have 3 of the following 5 criteria: abdominal obesity, high blood pressure, high triglycerides, low HDL cholesterol, and high blood  sugar.  Hypertension and Heart Disease Clinical staff conducted group or individual video education with verbal and written material and guidebook.  Patient learns that high blood pressure, or hypertension, is very common in the [place] . Hypertension is largely due to excessive salt intake, but other important risk factors include being overweight, physical inactivity, drinking too much alcohol, smoking, and not eating enough potassium from fruits and vegetables. High blood pressure is a leading risk factor for heart attack, stroke, congestive heart failure, dementia, kidney failure, and premature death. Long-term effects of excessive salt intake include stiffening of the arteries and thickening of heart muscle and organ damage. Recommendations include ways to reduce hypertension and the risk of heart disease.  Diseases of Our Time - Focusing on Diabetes Clinical staff conducted group or individual video education with verbal and written material and guidebook.  Patient learns why the best way to stop diseases of our time is prevention, through food and other lifestyle changes. Medicine (such as prescription pills and surgeries) is often only a Band-Aid on the problem, not a long-term solution. Most common diseases of our time include obesity, type 2 diabetes, hypertension, heart disease, and cancer. The Pritikin Program is recommended and has been proven to help reduce, reverse, and/or prevent the  damaging effects of metabolic syndrome.  Nutrition   Overview of the Pritikin Eating Plan  Clinical staff conducted group or individual video education with verbal and written material and guidebook.  Patient learns about the Pritikin Eating Plan for disease risk reduction. The Pritikin Eating Plan emphasizes a wide variety of unrefined, minimally-processed carbohydrates, like fruits, vegetables, whole grains, and legumes. Go, Caution, and Stop food choices are explained. Plant-based and lean animal  proteins are emphasized. Rationale provided for low sodium intake for blood pressure control, low added sugars for blood sugar stabilization, and low added fats and oils for coronary artery disease risk reduction and weight management.  Calorie Density  Clinical staff conducted group or individual video education with verbal and written material and guidebook.  Patient learns about calorie density and how it impacts the Pritikin Eating Plan. Knowing the characteristics of the food you choose will help you decide whether those foods will lead to weight gain or weight loss, and whether you want to consume more or less of them. Weight loss is usually a side effect of the Pritikin Eating Plan because of its focus on low calorie-dense foods.  Label Reading  Clinical staff conducted group or individual video education with verbal and written material and guidebook.  Patient learns about the Pritikin recommended label reading guidelines and corresponding recommendations regarding calorie density, added sugars, sodium content, and whole grains.  Dining Out - Part 1  Clinical staff conducted group or individual video education with verbal and written material and guidebook.  Patient learns that restaurant meals can be sabotaging because they can be so high in calories, fat, sodium, and/or sugar. Patient learns recommended strategies on how to positively address this and avoid unhealthy pitfalls.  Facts on Fats  Clinical staff conducted group or individual video education with verbal and written material and guidebook.  Patient learns that lifestyle modifications can be just as effective, if not more so, as many medications for lowering your risk of heart disease. A Pritikin lifestyle can help to reduce your risk of inflammation and atherosclerosis (cholesterol build-up, or plaque, in the artery walls). Lifestyle interventions such as dietary choices and physical activity address the cause of atherosclerosis.  A review of the types of fats and their impact on blood cholesterol levels, along with dietary recommendations to reduce fat intake is also included.  Nutrition Action Plan  Clinical staff conducted group or individual video education with verbal and written material and guidebook.  Patient learns how to incorporate Pritikin recommendations into their lifestyle. Recommendations include planning and keeping personal health goals in mind as an important part of their success.  Healthy Mind-Set    Healthy Minds, Bodies, Hearts  Clinical staff conducted group or individual video education with verbal and written material and guidebook.  Patient learns how to identify when they are stressed. Video will discuss the impact of that stress, as well as the many benefits of stress management. Patient will also be introduced to stress management techniques. The way we think, act, and feel has an impact on our hearts.  How Our Thoughts Can Heal Our Hearts  Clinical staff conducted group or individual video education with verbal and written material and guidebook.  Patient learns that negative thoughts can cause depression and anxiety. This can result in negative lifestyle behavior and serious health problems. Cognitive behavioral therapy is an effective method to help control our thoughts in order to change and improve our emotional outlook.  Additional Videos:  Exercise  Improving Performance  Clinical staff conducted group or individual video education with verbal and written material and guidebook.  Patient learns to use a non-linear approach by alternating intensity levels and lengths of time spent exercising to help burn more calories and lose more body fat. Cardiovascular exercise helps improve heart health, metabolism, hormonal balance, blood sugar control, and recovery from fatigue. Resistance training improves strength, endurance, balance, coordination, reaction time, metabolism, and muscle mass.  Flexibility exercise improves circulation, posture, and balance. Seek guidance from your physician and exercise physiologist before implementing an exercise routine and learn your capabilities and proper form for all exercise.  Introduction to Yoga  Clinical staff conducted group or individual video education with verbal and written material and guidebook.  Patient learns about yoga, a discipline of the coming together of mind, breath, and body. The benefits of yoga include improved flexibility, improved range of motion, better posture and core strength, increased lung function, weight loss, and positive self-image. Yogas heart health benefits include lowered blood pressure, healthier heart rate, decreased cholesterol and triglyceride levels, improved immune function, and reduced stress. Seek guidance from your physician and exercise physiologist before implementing an exercise routine and learn your capabilities and proper form for all exercise.  Medical   Aging: Enhancing Your Quality of Life  Clinical staff conducted group or individual video education with verbal and written material and guidebook.  Patient learns key strategies and recommendations to stay in good physical health and enhance quality of life, such as prevention strategies, having an advocate, securing a Health Care Proxy and Power of Attorney, and keeping a list of medications and system for tracking them. It also discusses how to avoid risk for bone loss.  Biology of Weight Control  Clinical staff conducted group or individual video education with verbal and written material and guidebook.  Patient learns that weight gain occurs because we consume more calories than we burn (eating more, moving less). Even if your body weight is normal, you may have higher ratios of fat compared to muscle mass. Too much body fat puts you at increased risk for cardiovascular disease, heart attack, stroke, type 2 diabetes, and obesity-related  cancers. In addition to exercise, following the Pritikin Eating Plan can help reduce your risk.  Decoding Lab Results  Clinical staff conducted group or individual video education with verbal and written material and guidebook.  Patient learns that lab test reflects one measurement whose values change over time and are influenced by many factors, including medication, stress, sleep, exercise, food, hydration, pre-existing medical conditions, and more. It is recommended to use the knowledge from this video to become more involved with your lab results and evaluate your numbers to speak with your doctor.   Diseases of Our Time - Overview  Clinical staff conducted group or individual video education with verbal and written material and guidebook.  Patient learns that according to the CDC, 50% to 70% of chronic diseases (such as obesity, type 2 diabetes, elevated lipids, hypertension, and heart disease) are avoidable through lifestyle improvements including healthier food choices, listening to satiety cues, and increased physical activity.  Sleep Disorders Clinical staff conducted group or individual video education with verbal and written material and guidebook.  Patient learns how good quality and duration of sleep are important to overall health and well-being. Patient also learns about sleep disorders and how they impact health along with recommendations to address them, including discussing with a physician.  Nutrition  Dining Out - Part 2 Clinical staff conducted  group or individual video education with verbal and written material and guidebook.  Patient learns how to plan ahead and communicate in order to maximize their dining experience in a healthy and nutritious manner. Included are recommended food choices based on the type of restaurant the patient is visiting.   Fueling a Banker conducted group or individual video education with verbal and written material and  guidebook.  There is a strong connection between our food choices and our health. Diseases like obesity and type 2 diabetes are very prevalent and are in large-part due to lifestyle choices. The Pritikin Eating Plan provides plenty of food and hunger-curbing satisfaction. It is easy to follow, affordable, and helps reduce health risks.  Menu Workshop  Clinical staff conducted group or individual video education with verbal and written material and guidebook.  Patient learns that restaurant meals can sabotage health goals because they are often packed with calories, fat, sodium, and sugar. Recommendations include strategies to plan ahead and to communicate with the manager, chef, or server to help order a healthier meal.  Planning Your Eating Strategy  Clinical staff conducted group or individual video education with verbal and written material and guidebook.  Patient learns about the Pritikin Eating Plan and its benefit of reducing the risk of disease. The Pritikin Eating Plan does not focus on calories. Instead, it emphasizes high-quality, nutrient-rich foods. By knowing the characteristics of the foods, we choose, we can determine their calorie density and make informed decisions.  Targeting Your Nutrition Priorities  Clinical staff conducted group or individual video education with verbal and written material and guidebook.  Patient learns that lifestyle habits have a tremendous impact on disease risk and progression. This video provides eating and physical activity recommendations based on your personal health goals, such as reducing LDL cholesterol, losing weight, preventing or controlling type 2 diabetes, and reducing high blood pressure.  Vitamins and Minerals  Clinical staff conducted group or individual video education with verbal and written material and guidebook.  Patient learns different ways to obtain key vitamins and minerals, including through a recommended healthy diet. It is  important to discuss all supplements you take with your doctor.   Healthy Mind-Set    Smoking Cessation  Clinical staff conducted group or individual video education with verbal and written material and guidebook.  Patient learns that cigarette smoking and tobacco addiction pose a serious health risk which affects millions of people. Stopping smoking will significantly reduce the risk of heart disease, lung disease, and many forms of cancer. Recommended strategies for quitting are covered, including working with your doctor to develop a successful plan.  Culinary   Becoming a Set Designer conducted group or individual video education with verbal and written material and guidebook.  Patient learns that cooking at home can be healthy, cost-effective, quick, and puts them in control. Keys to cooking healthy recipes will include looking at your recipe, assessing your equipment needs, planning ahead, making it simple, choosing cost-effective seasonal ingredients, and limiting the use of added fats, salts, and sugars.  Cooking - Breakfast and Snacks  Clinical staff conducted group or individual video education with verbal and written material and guidebook.  Patient learns how important breakfast is to satiety and nutrition through the entire day. Recommendations include key foods to eat during breakfast to help stabilize blood sugar levels and to prevent overeating at meals later in the day. Planning ahead is also a key component.  Cooking - Futures Trader  and Sides  Clinical staff conducted group or individual video education with verbal and written material and guidebook.  Patient learns eating strategies to improve overall health, including an approach to cook more at home. Recommendations include thinking of animal protein as a side on your plate rather than center stage and focusing instead on lower calorie dense options like vegetables, fruits, whole grains, and plant-based proteins,  such as beans. Making sauces in large quantities to freeze for later and leaving the skin on your vegetables are also recommended to maximize your experience.  Cooking - Healthy Salads and Dressing Clinical staff conducted group or individual video education with verbal and written material and guidebook.  Patient learns that vegetables, fruits, whole grains, and legumes are the foundations of the Pritikin Eating Plan. Recommendations include how to incorporate each of these in flavorful and healthy salads, and how to create homemade salad dressings. Proper handling of ingredients is also covered. Cooking - Soups and [employer] - Soups and Desserts Clinical staff conducted group or individual video education with verbal and written material and guidebook.  Patient learns that Pritikin soups and desserts make for easy, nutritious, and delicious snacks and meal components that are low in sodium, fat, sugar, and calorie density, while high in vitamins, minerals, and filling fiber. Recommendations include simple and healthy ideas for soups and desserts.   Overview     The Pritikin Solution Program Overview Clinical staff conducted group or individual video education with verbal and written material and guidebook.  Patient learns that the results of the Pritikin Program have been documented in more than 100 articles published in peer-reviewed journals, and the benefits include reducing risk factors for (and, in some cases, even reversing) high cholesterol, high blood pressure, type 2 diabetes, obesity, and more! An overview of the three key pillars of the Pritikin Program will be covered: eating well, doing regular exercise, and having a healthy mind-set.  WORKSHOPS  Exercise: Exercise Basics: Building Your Action Plan Clinical staff led group instruction and group discussion with PowerPoint presentation and patient guidebook. To enhance the learning environment the use of posters, models and  videos may be added. At the conclusion of this workshop, patients will comprehend the difference between physical activity and exercise, as well as the benefits of incorporating both, into their routine. Patients will understand the FITT (Frequency, Intensity, Time, and Type) principle and how to use it to build an exercise action plan. In addition, safety concerns and other considerations for exercise and cardiac rehab will be addressed by the presenter. The purpose of this lesson is to promote a comprehensive and effective weekly exercise routine in order to improve patients overall level of fitness.   Managing Heart Disease: Your Path to a Healthier Heart Clinical staff led group instruction and group discussion with PowerPoint presentation and patient guidebook. To enhance the learning environment the use of posters, models and videos may be added.At the conclusion of this workshop, patients will understand the anatomy and physiology of the heart. Additionally, they will understand how Pritikins three pillars impact the risk factors, the progression, and the management of heart disease.  The purpose of this lesson is to provide a high-level overview of the heart, heart disease, and how the Pritikin lifestyle positively impacts risk factors.  Exercise Biomechanics Clinical staff led group instruction and group discussion with PowerPoint presentation and patient guidebook. To enhance the learning environment the use of posters, models and videos may be added. Patients will learn how the  structural parts of their bodies function and how these functions impact their daily activities, movement, and exercise. Patients will learn how to promote a neutral spine, learn how to manage pain, and identify ways to improve their physical movement in order to promote healthy living. The purpose of this lesson is to expose patients to common physical limitations that impact physical activity. Participants  will learn practical ways to adapt and manage aches and pains, and to minimize their effect on regular exercise. Patients will learn how to maintain good posture while sitting, walking, and lifting.  Balance Training and Fall Prevention  Clinical staff led group instruction and group discussion with PowerPoint presentation and patient guidebook. To enhance the learning environment the use of posters, models and videos may be added. At the conclusion of this workshop, patients will understand the importance of their sensorimotor skills (vision, proprioception, and the vestibular system) in maintaining their ability to balance as they age. Patients will apply a variety of balancing exercises that are appropriate for their current level of function. Patients will understand the common causes for poor balance, possible solutions to these problems, and ways to modify their physical environment in order to minimize their fall risk. The purpose of this lesson is to teach patients about the importance of maintaining balance as they age and ways to minimize their risk of falling.  WORKSHOPS   Nutrition:  Fueling a Ship Broker led group instruction and group discussion with PowerPoint presentation and patient guidebook. To enhance the learning environment the use of posters, models and videos may be added. Patients will review the foundational principles of the Pritikin Eating Plan and understand what constitutes a serving size in each of the food groups. Patients will also learn Pritikin-friendly foods that are better choices when away from home and review make-ahead meal and snack options. Calorie density will be reviewed and applied to three nutrition priorities: weight maintenance, weight loss, and weight gain. The purpose of this lesson is to reinforce (in a group setting) the key concepts around what patients are recommended to eat and how to apply these guidelines when away from home by  planning and selecting Pritikin-friendly options. Patients will understand how calorie density may be adjusted for different weight management goals.  Mindful Eating  Clinical staff led group instruction and group discussion with PowerPoint presentation and patient guidebook. To enhance the learning environment the use of posters, models and videos may be added. Patients will briefly review the concepts of the Pritikin Eating Plan and the importance of low-calorie dense foods. The concept of mindful eating will be introduced as well as the importance of paying attention to internal hunger signals. Triggers for non-hunger eating and techniques for dealing with triggers will be explored. The purpose of this lesson is to provide patients with the opportunity to review the basic principles of the Pritikin Eating Plan, discuss the value of eating mindfully and how to measure internal cues of hunger and fullness using the Hunger Scale. Patients will also discuss reasons for non-hunger eating and learn strategies to use for controlling emotional eating.  Targeting Your Nutrition Priorities Clinical staff led group instruction and group discussion with PowerPoint presentation and patient guidebook. To enhance the learning environment the use of posters, models and videos may be added. Patients will learn how to determine their genetic susceptibility to disease by reviewing their family history. Patients will gain insight into the importance of diet as part of an overall healthy lifestyle in mitigating the  impact of genetics and other environmental insults. The purpose of this lesson is to provide patients with the opportunity to assess their personal nutrition priorities by looking at their family history, their own health history and current risk factors. Patients will also be able to discuss ways of prioritizing and modifying the Pritikin Eating Plan for their highest risk areas  Menu  Clinical staff led group  instruction and group discussion with PowerPoint presentation and patient guidebook. To enhance the learning environment the use of posters, models and videos may be added. Using menus brought in from [employer], or printed from [employer], patients will apply the Pritikin dining out guidelines that were presented in the [employer] video. Patients will also be able to practice these guidelines in a variety of provided scenarios. The purpose of this lesson is to provide patients with the opportunity to practice hands-on learning of the Pritikin Dining Out guidelines with actual menus and practice scenarios.  Label Reading Clinical staff led group instruction and group discussion with PowerPoint presentation and patient guidebook. To enhance the learning environment the use of posters, models and videos may be added. Patients will review and discuss the Pritikin label reading guidelines presented in Pritikins Label Reading Educational series video. Using fool labels brought in from local grocery stores and markets, patients will apply the label reading guidelines and determine if the packaged food meet the Pritikin guidelines. The purpose of this lesson is to provide patients with the opportunity to review, discuss, and practice hands-on learning of the Pritikin Label Reading guidelines with actual packaged food labels. Cooking School  Pritikins [employer] are designed to teach patients ways to prepare quick, simple, and affordable recipes at home. The importance of nutritions role in chronic disease risk reduction is reflected in its emphasis in the overall Pritikin program. By learning how to prepare essential core Pritikin Eating Plan recipes, patients will increase control over what they eat; be able to customize the flavor of foods without the use of added salt, sugar, or fat; and improve the quality of the food they consume. By learning a set of core recipes  which are easily assembled, quickly prepared, and affordable, patients are more likely to prepare more healthy foods at home. These workshops focus on convenient breakfasts, simple entres, side dishes, and desserts which can be prepared with minimal effort and are consistent with nutrition recommendations for cardiovascular risk reduction. Cooking [employer] are taught by a armed forces logistics/support/administrative officer (RD) who has been trained by the [employer]. The chef or RD has a clear understanding of the importance of minimizing - if not completely eliminating - added fat, sugar, and sodium in recipes. Throughout the series of Cooking School Workshop sessions, patients will learn about healthy ingredients and efficient methods of cooking to build confidence in their capability to prepare    Cooking School weekly topics:  Adding Flavor- Sodium-Free  Fast and Healthy Breakfasts  Powerhouse Plant-Based Proteins  Satisfying Salads and Dressings  Simple Sides and Sauces  International Cuisine-Spotlight on the [employer] Zones  Delicious Desserts  Savory Soups  [employer] - Meals in a Astronomer Appetizers and Snacks  Comforting Weekend Breakfasts  One-Pot Wonders   Fast Evening Meals  Landscape Architect Your Pritikin Plate  WORKSHOPS   Healthy Mindset (Psychosocial):  Focused Goals, Sustainable Changes Clinical staff led group instruction and group discussion with PowerPoint presentation and patient guidebook. To enhance the learning environment the  use of posters, models and videos may be added. Patients will be able to apply effective goal setting strategies to establish at least one personal goal, and then take consistent, meaningful action toward that goal. They will learn to identify common barriers to achieving personal goals and develop strategies to overcome them. Patients will also gain an understanding of how our mind-set can impact our ability to achieve  goals and the importance of cultivating a positive and growth-oriented mind-set. The purpose of this lesson is to provide patients with a deeper understanding of how to set and achieve personal goals, as well as the tools and strategies needed to overcome common obstacles which may arise along the way.  From Head to Heart: The Power of a Healthy Outlook  Clinical staff led group instruction and group discussion with PowerPoint presentation and patient guidebook. To enhance the learning environment the use of posters, models and videos may be added. Patients will be able to recognize and describe the impact of emotions and mood on physical health. They will discover the importance of self-care and explore self-care practices which may work for them. Patients will also learn how to utilize the 4 Cs to cultivate a healthier outlook and better manage stress and challenges. The purpose of this lesson is to demonstrate to patients how a healthy outlook is an essential part of maintaining good health, especially as they continue their cardiac rehab journey.  Healthy Sleep for a Healthy Heart Clinical staff led group instruction and group discussion with PowerPoint presentation and patient guidebook. To enhance the learning environment the use of posters, models and videos may be added. At the conclusion of this workshop, patients will be able to demonstrate knowledge of the importance of sleep to overall health, well-being, and quality of life. They will understand the symptoms of, and treatments for, common sleep disorders. Patients will also be able to identify daytime and nighttime behaviors which impact sleep, and they will be able to apply these tools to help manage sleep-related challenges. The purpose of this lesson is to provide patients with a general overview of sleep and outline the importance of quality sleep. Patients will learn about a few of the most common sleep disorders. Patients will also be  introduced to the concept of sleep hygiene, and discover ways to self-manage certain sleeping problems through simple daily behavior changes. Finally, the workshop will motivate patients by clarifying the links between quality sleep and their goals of heart-healthy living.   Recognizing and Reducing Stress Clinical staff led group instruction and group discussion with PowerPoint presentation and patient guidebook. To enhance the learning environment the use of posters, models and videos may be added. At the conclusion of this workshop, patients will be able to understand the types of stress reactions, differentiate between acute and chronic stress, and recognize the impact that chronic stress has on their health. They will also be able to apply different coping mechanisms, such as reframing negative self-talk. Patients will have the opportunity to practice a variety of stress management techniques, such as deep abdominal breathing, progressive muscle relaxation, and/or guided imagery.  The purpose of this lesson is to educate patients on the role of stress in their lives and to provide healthy techniques for coping with it.  Learning Barriers/Preferences:  Learning Barriers/Preferences - 10/12/24 1652       Learning Barriers/Preferences   Learning Barriers None    Learning Preferences Skilled Demonstration;Pictoral;Group Instruction;Individual Instruction;Written Material;Video  Education Topics:  Knowledge Questionnaire Score:  Knowledge Questionnaire Score - 10/12/24 1652       Knowledge Questionnaire Score   Pre Score 22/24          Core Components/Risk Factors/Patient Goals at Admission:  Personal Goals and Risk Factors at Admission - 10/12/24 1654       Core Components/Risk Factors/Patient Goals on Admission   Diabetes Yes    Intervention Provide education about signs/symptoms and action to take for hypo/hyperglycemia.;Provide education about proper nutrition,  including hydration, and aerobic/resistive exercise prescription along with prescribed medications to achieve blood glucose in normal ranges: Fasting glucose 65-99 mg/dL    Expected Outcomes Short Term: Participant verbalizes understanding of the signs/symptoms and immediate care of hyper/hypoglycemia, proper foot care and importance of medication, aerobic/resistive exercise and nutrition plan for blood glucose control.;Long Term: Attainment of HbA1C < 7%.    Hypertension Yes    Intervention Provide education on lifestyle modifcations including regular physical activity/exercise, weight management, moderate sodium restriction and increased consumption of fresh fruit, vegetables, and low fat dairy, alcohol moderation, and smoking cessation.;Monitor prescription use compliance.    Expected Outcomes Short Term: Continued assessment and intervention until BP is < 140/34mm HG in hypertensive participants. < 130/49mm HG in hypertensive participants with diabetes, heart failure or chronic kidney disease.;Long Term: Maintenance of blood pressure at goal levels.    Lipids Yes    Intervention Provide education and support for participant on nutrition & aerobic/resistive exercise along with prescribed medications to achieve LDL 70mg , HDL >40mg .    Expected Outcomes Short Term: Participant states understanding of desired cholesterol values and is compliant with medications prescribed. Participant is following exercise prescription and nutrition guidelines.;Long Term: Cholesterol controlled with medications as prescribed, with individualized exercise RX and with personalized nutrition plan. Value goals: LDL < 70mg , HDL > 40 mg.    Stress Yes    Intervention Offer individual and/or small group education and counseling on adjustment to heart disease, stress management and health-related lifestyle change. Teach and support self-help strategies.;Refer participants experiencing significant psychosocial distress to  appropriate mental health specialists for further evaluation and treatment. When possible, include family members and significant others in education/counseling sessions.    Expected Outcomes Short Term: Participant demonstrates changes in health-related behavior, relaxation and other stress management skills, ability to obtain effective social support, and compliance with psychotropic medications if prescribed.;Long Term: Emotional wellbeing is indicated by absence of clinically significant psychosocial distress or social isolation.    Personal Goal Other Yes    Personal Goal ST:UB strength, ROM with sternal precautions LT: tune up for travel, endurance    Intervention Will continue to monitor pt and progress WL's as tolerated without sign or symptom    Expected Outcomes Pt will achieve her goals          Core Components/Risk Factors/Patient Goals Review:   Goals and Risk Factor Review     Row Name 11/07/24 1421             Core Components/Risk Factors/Patient Goals Review   Personal Goals Review Diabetes;Hypertension;Lipids;Stress       Review Norvella is doing well with exercise at cardiac rehab since her 10/16/24 start. Vital signs have been stable, MET levels have been maintained       Expected Outcomes Karem will continue to particpate in cardiac rehab for exercise nutrition and lifestyle modifications.          Core Components/Risk Factors/Patient Goals at Discharge (Final Review):   Goals and  Risk Factor Review - 11/07/24 1421       Core Components/Risk Factors/Patient Goals Review   Personal Goals Review Diabetes;Hypertension;Lipids;Stress    Review Radiah is doing well with exercise at cardiac rehab since her 10/16/24 start. Vital signs have been stable, MET levels have been maintained    Expected Outcomes Lulie will continue to particpate in cardiac rehab for exercise nutrition and lifestyle modifications.          ITP Comments:  ITP Comments     Row  Name 10/12/24 1325 10/16/24 1417 11/07/24 1417       ITP Comments Wilbert Bihari, MD: Medical Director. Introduction to the [facility]/Intensive [facility]iac [facility]hab. Initial orientation packet reviewed with patient. 30 Day ITP Review. Sondra started cardiac rehab today (10/16/24) and did well with exercise 30 Day ITP Review. Iszabella is tolerating exercise well since she started cardiac rehab on 10/16/24        Comments: see ITP comments    [1]  Current Outpatient Medications:    acetaminophen (TYLENOL) 500 MG tablet, Take 500 mg by mouth every 6 (six) hours as needed for moderate pain (pain score 4-6)., Disp: , Rfl:    aspirin 81 MG chewable tablet, Chew 81 mg by mouth daily., Disp: , Rfl:    atorvastatin (LIPITOR) 20 MG tablet, Take 20 mg by mouth daily., Disp: , Rfl:    cyclobenzaprine  (FLEXERIL ) 5 MG tablet, Take 1 tablet (5 mg total) by mouth 3 (three) times daily as needed for muscle spasms. (Patient not taking: Reported on 10/18/2024), Disp: 30 tablet, Rfl: 0   diclofenac  (VOLTAREN ) 50 MG EC tablet, Take 1 tablet (50 mg total) by mouth 2 (two) times daily. (Patient not taking: Reported on 10/18/2024), Disp: 15 tablet, Rfl: 0   furosemide (LASIX) 40 MG tablet, Take 40 mg by mouth daily. (Patient not taking: Reported on 10/18/2024), Disp: , Rfl:    JARDIANCE 25 MG TABS tablet, Take 10 mg by mouth daily., Disp: , Rfl:    lidocaine  (LIDODERM ) 5 %, Place 1 patch onto the skin daily. Remove & Discard patch within 12 hours or as directed by MD (Patient not taking: Reported on 10/18/2024), Disp: 30 patch, Rfl: 0   lisinopril (PRINIVIL,ZESTRIL) 5 MG tablet, Take 5 mg by mouth daily. (Patient not taking: Reported on 10/18/2024), Disp: , Rfl:    metoprolol tartrate (LOPRESSOR) 25 MG tablet, Take 25 mg by mouth 2 (two) times daily. (Patient taking differently: Take 12.5 mg by mouth 2 (two) times daily.), Disp: , Rfl:    omeprazole (PRILOSEC) 20 MG capsule, Take 20 mg by mouth daily., Disp:  , Rfl:    oxyCODONE (OXY IR/ROXICODONE) 5 MG immediate release tablet, Take 5 mg by mouth every 6 (six) hours as needed for severe pain (pain score 7-10)., Disp: , Rfl:    OZEMPIC, 0.25 OR 0.5 MG/DOSE, 2 MG/3ML SOPN, Inject 5 mg into the skin once a week., Disp: , Rfl:    senna-docusate (SENOKOT-S) 8.6-50 MG tablet, Take 2 tablets by mouth daily as needed for mild constipation., Disp: , Rfl:    warfarin (COUMADIN) 5 MG tablet, Take 5 mg by mouth daily., Disp: , Rfl:    zolpidem (AMBIEN) 10 MG tablet, Take 10 mg by mouth at bedtime as needed., Disp: , Rfl:  [2]  Social History Tobacco Use  Smoking Status Never  Smokeless Tobacco Not on file

## 2024-11-08 ENCOUNTER — Encounter (HOSPITAL_COMMUNITY)
Admission: RE | Admit: 2024-11-08 | Discharge: 2024-11-08 | Disposition: A | Discharge status: Home / Self Care | Source: Ambulatory Visit | Attending: Cardiology | Admitting: Cardiology

## 2024-11-08 DIAGNOSIS — Z952 Presence of prosthetic heart valve: Secondary | ICD-10-CM | POA: Diagnosis not present

## 2024-11-10 ENCOUNTER — Encounter (HOSPITAL_COMMUNITY)

## 2024-11-12 ENCOUNTER — Emergency Department (HOSPITAL_COMMUNITY)

## 2024-11-12 ENCOUNTER — Other Ambulatory Visit: Payer: Self-pay

## 2024-11-12 ENCOUNTER — Inpatient Hospital Stay (HOSPITAL_COMMUNITY)
Admission: EM | Admit: 2024-11-12 | Discharge: 2024-11-19 | DRG: 378 | Disposition: A | Discharge status: Home / Self Care | Attending: Family Medicine | Admitting: Family Medicine

## 2024-11-12 ENCOUNTER — Encounter (HOSPITAL_COMMUNITY): Payer: Self-pay

## 2024-11-12 DIAGNOSIS — I429 Cardiomyopathy, unspecified: Secondary | ICD-10-CM | POA: Diagnosis present

## 2024-11-12 DIAGNOSIS — I10 Essential (primary) hypertension: Secondary | ICD-10-CM | POA: Diagnosis present

## 2024-11-12 DIAGNOSIS — K449 Diaphragmatic hernia without obstruction or gangrene: Secondary | ICD-10-CM | POA: Diagnosis present

## 2024-11-12 DIAGNOSIS — E1122 Type 2 diabetes mellitus with diabetic chronic kidney disease: Secondary | ICD-10-CM | POA: Diagnosis present

## 2024-11-12 DIAGNOSIS — K317 Polyp of stomach and duodenum: Secondary | ICD-10-CM | POA: Diagnosis present

## 2024-11-12 DIAGNOSIS — Z90710 Acquired absence of both cervix and uterus: Secondary | ICD-10-CM

## 2024-11-12 DIAGNOSIS — D649 Anemia, unspecified: Secondary | ICD-10-CM | POA: Diagnosis present

## 2024-11-12 DIAGNOSIS — Z7982 Long term (current) use of aspirin: Secondary | ICD-10-CM | POA: Diagnosis not present

## 2024-11-12 DIAGNOSIS — Z833 Family history of diabetes mellitus: Secondary | ICD-10-CM

## 2024-11-12 DIAGNOSIS — D509 Iron deficiency anemia, unspecified: Secondary | ICD-10-CM | POA: Diagnosis present

## 2024-11-12 DIAGNOSIS — Z9889 Other specified postprocedural states: Secondary | ICD-10-CM

## 2024-11-12 DIAGNOSIS — F32A Depression, unspecified: Secondary | ICD-10-CM | POA: Diagnosis present

## 2024-11-12 DIAGNOSIS — Z79899 Other long term (current) drug therapy: Secondary | ICD-10-CM

## 2024-11-12 DIAGNOSIS — D599 Acquired hemolytic anemia, unspecified: Secondary | ICD-10-CM | POA: Diagnosis present

## 2024-11-12 DIAGNOSIS — Z7985 Long-term (current) use of injectable non-insulin antidiabetic drugs: Secondary | ICD-10-CM

## 2024-11-12 DIAGNOSIS — I5032 Chronic diastolic (congestive) heart failure: Secondary | ICD-10-CM | POA: Diagnosis not present

## 2024-11-12 DIAGNOSIS — K2960 Other gastritis without bleeding: Secondary | ICD-10-CM | POA: Diagnosis not present

## 2024-11-12 DIAGNOSIS — K5730 Diverticulosis of large intestine without perforation or abscess without bleeding: Secondary | ICD-10-CM | POA: Diagnosis present

## 2024-11-12 DIAGNOSIS — K219 Gastro-esophageal reflux disease without esophagitis: Secondary | ICD-10-CM | POA: Diagnosis present

## 2024-11-12 DIAGNOSIS — I130 Hypertensive heart and chronic kidney disease with heart failure and stage 1 through stage 4 chronic kidney disease, or unspecified chronic kidney disease: Secondary | ICD-10-CM | POA: Diagnosis present

## 2024-11-12 DIAGNOSIS — N1831 Chronic kidney disease, stage 3a: Secondary | ICD-10-CM | POA: Diagnosis present

## 2024-11-12 DIAGNOSIS — Z7984 Long term (current) use of oral hypoglycemic drugs: Secondary | ICD-10-CM | POA: Diagnosis not present

## 2024-11-12 DIAGNOSIS — Z881 Allergy status to other antibiotic agents status: Secondary | ICD-10-CM

## 2024-11-12 DIAGNOSIS — E785 Hyperlipidemia, unspecified: Secondary | ICD-10-CM | POA: Diagnosis present

## 2024-11-12 DIAGNOSIS — Z952 Presence of prosthetic heart valve: Secondary | ICD-10-CM

## 2024-11-12 DIAGNOSIS — Z8249 Family history of ischemic heart disease and other diseases of the circulatory system: Secondary | ICD-10-CM

## 2024-11-12 DIAGNOSIS — Z91040 Latex allergy status: Secondary | ICD-10-CM

## 2024-11-12 DIAGNOSIS — I059 Rheumatic mitral valve disease, unspecified: Secondary | ICD-10-CM | POA: Diagnosis not present

## 2024-11-12 DIAGNOSIS — Z90722 Acquired absence of ovaries, bilateral: Secondary | ICD-10-CM

## 2024-11-12 DIAGNOSIS — K922 Gastrointestinal hemorrhage, unspecified: Principal | ICD-10-CM | POA: Diagnosis present

## 2024-11-12 DIAGNOSIS — E669 Obesity, unspecified: Secondary | ICD-10-CM | POA: Diagnosis present

## 2024-11-12 DIAGNOSIS — Z888 Allergy status to other drugs, medicaments and biological substances status: Secondary | ICD-10-CM

## 2024-11-12 DIAGNOSIS — I080 Rheumatic disorders of both mitral and aortic valves: Secondary | ICD-10-CM | POA: Diagnosis not present

## 2024-11-12 DIAGNOSIS — Z7901 Long term (current) use of anticoagulants: Secondary | ICD-10-CM

## 2024-11-12 DIAGNOSIS — K222 Esophageal obstruction: Secondary | ICD-10-CM | POA: Diagnosis present

## 2024-11-12 DIAGNOSIS — Z6824 Body mass index (BMI) 24.0-24.9, adult: Secondary | ICD-10-CM

## 2024-11-12 LAB — CBC
HCT: 16.3 % — ABNORMAL LOW (ref 36.0–46.0)
HCT: 22.8 % — ABNORMAL LOW (ref 36.0–46.0)
Hemoglobin: 4.6 g/dL — CL (ref 12.0–15.0)
Hemoglobin: 7.3 g/dL — ABNORMAL LOW (ref 12.0–15.0)
MCH: 24.3 pg — ABNORMAL LOW (ref 26.0–34.0)
MCH: 27.4 pg (ref 26.0–34.0)
MCHC: 28.2 g/dL — ABNORMAL LOW (ref 30.0–36.0)
MCHC: 32 g/dL (ref 30.0–36.0)
MCV: 86.2 fL (ref 80.0–100.0)
MCV: 85.7 fL (ref 80.0–100.0)
Platelets: 264 K/uL (ref 150–400)
Platelets: 236 K/uL (ref 150–400)
RBC: 1.89 MIL/uL — ABNORMAL LOW (ref 3.87–5.11)
RBC: 2.66 MIL/uL — ABNORMAL LOW (ref 3.87–5.11)
RDW: 17.3 % — ABNORMAL HIGH (ref 11.5–15.5)
RDW: 15.9 % — ABNORMAL HIGH (ref 11.5–15.5)
WBC: 9.5 K/uL (ref 4.0–10.5)
WBC: 9.2 K/uL (ref 4.0–10.5)
nRBC: 0.2 % (ref 0.0–0.2)
nRBC: 0.2 % (ref 0.0–0.2)

## 2024-11-12 LAB — URINALYSIS, ROUTINE W REFLEX MICROSCOPIC
Bacteria, UA: NONE SEEN
Bilirubin Urine: NEGATIVE
Glucose, UA: >=500 mg/dL — AB
Hgb urine dipstick: NEGATIVE
Ketones, ur: NEGATIVE mg/dL
Leukocytes,Ua: NEGATIVE
Nitrite: NEGATIVE
Protein, ur: NEGATIVE mg/dL
Specific Gravity, Urine: 1.017 (ref 1.005–1.030)
pH: 7 (ref 5.0–8.0)

## 2024-11-12 LAB — COMPREHENSIVE METABOLIC PANEL WITH GFR
ALT: 21 U/L (ref 0–44)
AST: 23 U/L (ref 15–41)
Albumin: 4.1 g/dL (ref 3.5–5.0)
Alkaline Phosphatase: 96 U/L (ref 38–126)
Anion gap: 13 (ref 5–15)
BUN: 26 mg/dL — ABNORMAL HIGH (ref 6–20)
CO2: 24 mmol/L (ref 22–32)
Calcium: 9.8 mg/dL (ref 8.9–10.3)
Chloride: 104 mmol/L (ref 98–111)
Creatinine, Ser: 1.18 mg/dL — ABNORMAL HIGH (ref 0.44–1.00)
GFR, Estimated: 53 mL/min — ABNORMAL LOW
Glucose, Bld: 195 mg/dL — ABNORMAL HIGH (ref 70–99)
Potassium: 3.5 mmol/L (ref 3.5–5.1)
Sodium: 141 mmol/L (ref 135–145)
Total Bilirubin: 0.4 mg/dL (ref 0.0–1.2)
Total Protein: 6.6 g/dL (ref 6.5–8.1)

## 2024-11-12 LAB — CBC WITH DIFFERENTIAL/PLATELET
Abs Immature Granulocytes: 0.05 K/uL (ref 0.00–0.07)
Basophils Absolute: 0 K/uL (ref 0.0–0.1)
Basophils Relative: 0 %
Eosinophils Absolute: 0 K/uL (ref 0.0–0.5)
Eosinophils Relative: 1 %
HCT: 15.2 % — ABNORMAL LOW (ref 36.0–46.0)
Hemoglobin: 4.4 g/dL — CL (ref 12.0–15.0)
Immature Granulocytes: 1 %
Lymphocytes Relative: 15 %
Lymphs Abs: 1.1 K/uL (ref 0.7–4.0)
MCH: 24.2 pg — ABNORMAL LOW (ref 26.0–34.0)
MCHC: 28.9 g/dL — ABNORMAL LOW (ref 30.0–36.0)
MCV: 83.5 fL (ref 80.0–100.0)
Monocytes Absolute: 0.4 K/uL (ref 0.1–1.0)
Monocytes Relative: 5 %
Neutro Abs: 5.8 K/uL (ref 1.7–7.7)
Neutrophils Relative %: 78 %
Platelets: 250 K/uL (ref 150–400)
RBC: 1.82 MIL/uL — ABNORMAL LOW (ref 3.87–5.11)
RDW: 17.2 % — ABNORMAL HIGH (ref 11.5–15.5)
WBC: 7.4 K/uL (ref 4.0–10.5)
nRBC: 0 % (ref 0.0–0.2)

## 2024-11-12 LAB — IRON AND TIBC
Iron: 26 ug/dL — ABNORMAL LOW (ref 28–170)
Saturation Ratios: 8 % — ABNORMAL LOW (ref 10.4–31.8)
TIBC: 340 ug/dL (ref 250–450)
UIBC: 314 ug/dL

## 2024-11-12 LAB — PROTIME-INR
INR: 2.9 — ABNORMAL HIGH (ref 0.8–1.2)
Prothrombin Time: 31.7 s — ABNORMAL HIGH (ref 11.4–15.2)

## 2024-11-12 LAB — C-REACTIVE PROTEIN: CRP: <0.5 mg/dL

## 2024-11-12 LAB — SEDIMENTATION RATE: Sed Rate: 35 mm/h — ABNORMAL HIGH (ref 0–22)

## 2024-11-12 LAB — LACTATE DEHYDROGENASE: LDH: 391 U/L — ABNORMAL HIGH (ref 105–235)

## 2024-11-12 LAB — TROPONIN T, HIGH SENSITIVITY: Troponin T High Sensitivity: 27 ng/L — ABNORMAL HIGH (ref 0–19)

## 2024-11-12 LAB — PREPARE RBC (CROSSMATCH)

## 2024-11-12 LAB — VITAMIN B12: Vitamin B-12: 406 pg/mL (ref 180–914)

## 2024-11-12 LAB — HIV ANTIBODY (ROUTINE TESTING W REFLEX): HIV Screen 4th Generation wRfx: NONREACTIVE

## 2024-11-12 LAB — FERRITIN: Ferritin: 11 ng/mL (ref 11–307)

## 2024-11-12 LAB — POC OCCULT BLOOD, ED: Fecal Occult Bld: POSITIVE — AB

## 2024-11-12 LAB — ABO/RH: ABO/RH(D): O POS

## 2024-11-12 LAB — FOLATE: Folate: 12.2 ng/mL

## 2024-11-12 MED ORDER — SODIUM CHLORIDE 0.9% IV SOLUTION: Freq: Once | INTRAVENOUS | Status: AC

## 2024-11-12 MED ORDER — ACETAMINOPHEN 650 MG RE SUPP: 650.0000 mg | Freq: Four times a day (QID) | RECTAL | Status: DC | PRN

## 2024-11-12 MED ORDER — ATORVASTATIN CALCIUM 10 MG PO TABS
20.0000 mg | ORAL_TABLET | Freq: Every day | ORAL | Status: DC
  Administered 2024-11-13 – 2024-11-19 (×7): 20 mg via ORAL
  Filled 2024-11-12 (×7): qty 2

## 2024-11-12 MED ORDER — SODIUM CHLORIDE 0.9% FLUSH
3.0000 mL | Freq: Two times a day (BID) | INTRAVENOUS | Status: DC
  Administered 2024-11-12 – 2024-11-19 (×13): 3 mL via INTRAVENOUS

## 2024-11-12 MED ORDER — PANTOPRAZOLE SODIUM 40 MG IV SOLR
40.0000 mg | Freq: Two times a day (BID) | INTRAVENOUS | Status: DC
  Administered 2024-11-12 – 2024-11-19 (×14): 40 mg via INTRAVENOUS
  Filled 2024-11-12 (×14): qty 10

## 2024-11-12 MED ORDER — METOPROLOL TARTRATE 12.5 MG HALF TABLET
12.5000 mg | ORAL_TABLET | Freq: Two times a day (BID) | ORAL | Status: DC
  Administered 2024-11-12 – 2024-11-18 (×13): 12.5 mg via ORAL
  Filled 2024-11-12 (×13): qty 1

## 2024-11-12 MED ORDER — ACETAMINOPHEN 325 MG PO TABS
650.0000 mg | ORAL_TABLET | Freq: Four times a day (QID) | ORAL | Status: DC | PRN
  Administered 2024-11-13 – 2024-11-14 (×3): 650 mg via ORAL
  Filled 2024-11-12 (×4): qty 2

## 2024-11-12 MED ORDER — PANTOPRAZOLE SODIUM 40 MG IV SOLR
40.0000 mg | Freq: Once | INTRAVENOUS | Status: AC
  Administered 2024-11-12: 40 mg via INTRAVENOUS

## 2024-11-12 MED ORDER — ZOLPIDEM TARTRATE 5 MG PO TABS
5.0000 mg | ORAL_TABLET | Freq: Every evening | ORAL | Status: DC | PRN
  Administered 2024-11-13 – 2024-11-18 (×4): 5 mg via ORAL
  Filled 2024-11-12 (×4): qty 1

## 2024-11-12 NOTE — H&P (Addendum)
 " History and Physical   Natasha Mcknight FMW:[id number] DOB: 1966-03-11 DOA: 11/12/2024  PCP: Vernon Velna SAUNDERS, MD   Patient coming from: Home  Chief Complaint:   HPI: Natasha Mcknight is a 59 y.o. female with medical history significant of Hypertension, GERD, status post aortic valve replacement, status post mitral valve replacement, anemia presenting with dizziness, nausea, vomiting.  Patient reports 3 days of increasing dizziness that is worse on standing and with activity.  Also reports 3 days of nausea vomiting in that time.  Denies any black or bloody stools but does report some intermittent dark brown stools.  Reports chronic intermittent chest pain since having her mitral aortic valve replaced in September at [facility].  Denies fevers, chills, constipation, diarrhea.   ED Course: Vital signs in the ED notable for heart rate in the 100s-110s, blood pressure in the 110s-120s systolic.    Lab workup included CMP with BUN 26, creatinine 1.18 which is stable, glucose 195.  CBC with hemoglobin of 4.4, confirmed at 4.6 on repeat.  Troponin T27, repeat pending.  ESR 35.  CRP normal.  LDH elevated to 391.  Haptoglobin pending.  Urinalysis with glucose only.  Type and screen performed.  FOBT positive. CTh no acute normality.  Patient given IV PPI in the ED and 4 units of PRBCs prepped and 2 units ordered for transfusion.  Case discussed with patient's cardiothoracic surgeon at [facility].  Stated it was reasonable for patient to be admitted here and get TTE done  and if that looks okay TEE.  If abnormality found with valve patient will likely be sent back to [facility] to be evaluated by their team.  Review of Systems: As per HPI otherwise all other systems reviewed and are negative.  Past Medical History:  Diagnosis Date   Anemia    Clotting disorder    Endometriosis    Fibroids    GERD (gastroesophageal reflux disease)    Murmur, cardiac     Past Surgical History:  Procedure Laterality Date    LAPAROSCOPIC SALPINGOOPHERECTOMY  1982   left endodermal sinus tumor   mitral valvle repair  03/2009   [facility]-Dr. Liana   MYOMECTOMY ABDOMINAL APPROACH  1991   OVARIAN CYST REMOVAL  1991   stomach tumor removal  1985   TOTAL ABDOMINAL HYSTERECTOMY W/ BILATERAL SALPINGOOPHORECTOMY  2006   chronic pelvic pain    Social History  reports that she has never smoked. She does not have any smokeless tobacco history on file. No history on file for alcohol use and drug use.  Allergies[1]  Family History  Problem Relation Age of Onset   Cancer Maternal Grandmother    Hypertension Father    Diabetes Mother    Renal Disease Mother   Reviewed on admission  Prior to Admission medications  Medication Sig Start Date End Date Taking? Authorizing Provider  acetaminophen  (TYLENOL ) 500 MG tablet Take 500 mg by mouth every 6 (six) hours as needed for moderate pain (pain score 4-6).   Yes [provider]  aspirin 81 MG chewable tablet Chew 81 mg by mouth daily.   Yes [provider]  atorvastatin  (LIPITOR) 20 MG tablet Take 20 mg by mouth daily. 12/24/21  Yes [provider]  furosemide (LASIX) 20 MG tablet Take 20 mg by mouth as needed for fluid. 09/29/24  Yes [provider]  JARDIANCE 25 MG TABS tablet Take 10 mg by mouth daily.   Yes [provider]  metoprolol  tartrate (LOPRESSOR ) 25 MG tablet Take  25 mg by mouth 2 (two) times daily. Patient taking differently: Take 12.5 mg by mouth 2 (two) times daily. 08/12/24  Yes [provider]  omeprazole (PRILOSEC) 20 MG capsule Take 20 mg by mouth daily.   Yes [provider]  oxyCODONE (OXY IR/ROXICODONE) 5 MG immediate release tablet Take 5 mg by mouth every 6 (six) hours as needed for severe pain (pain score 7-10).   Yes [provider]  OZEMPIC, 0.25 OR 0.5 MG/DOSE, 2 MG/3ML SOPN Inject 0.5 mg into the skin once a week. 07/23/23  Yes [provider]  senna-docusate  (SENOKOT-S) 8.6-50 MG tablet Take 2 tablets by mouth daily as needed for mild constipation.   Yes [provider]  warfarin (COUMADIN) 5 MG tablet Take 5 mg by mouth daily.   Yes [provider]  zolpidem  (AMBIEN ) 10 MG tablet Take 10 mg by mouth at bedtime as needed.   Yes [provider]    Physical Exam: Vitals:   11/12/24 1415 11/12/24 1430 11/12/24 1445 11/12/24 1500  BP: (!) 129/59 125/62 137/65 126/65  Pulse: (!) 109 (!) 109 (!) 109 (!) 107  Resp: 18 (!) 21 18 19   Temp:      TempSrc:      SpO2: 100% 100% 100% 100%  Weight:      Height:        Physical Exam Constitutional:      General: She is not in acute distress.    Appearance: Normal appearance.  HENT:     Head: Normocephalic and atraumatic.     Mouth/Throat:     Mouth: Mucous membranes are moist.     Pharynx: Oropharynx is clear.  Eyes:     Extraocular Movements: Extraocular movements intact.     Pupils: Pupils are equal, round, and reactive to light.  Cardiovascular:     Rate and Rhythm: Normal rate and regular rhythm.     Pulses: Normal pulses.     Heart sounds: Normal heart sounds.  Pulmonary:     Effort: Pulmonary effort is normal. No respiratory distress.     Breath sounds: Normal breath sounds.  Abdominal:     General: Bowel sounds are normal. There is no distension.     Palpations: Abdomen is soft.     Tenderness: There is no abdominal tenderness.  Musculoskeletal:        General: No swelling or deformity.  Skin:    General: Skin is warm and dry.  Neurological:     General: No focal deficit present.     Mental Status: Mental status is at baseline.    Labs on Admission: I have personally reviewed following labs and imaging studies  CBC: Recent Labs  Lab 11/12/24 0945 11/12/24 1143  WBC 7.4 9.5  NEUTROABS 5.8  --   HGB 4.4* 4.6*  HCT 15.2* 16.3*  MCV 83.5 86.2  PLT 250 264    Basic Metabolic Panel: Recent Labs  Lab 11/12/24 0945  NA 141  K 3.5  CL 104   CO2 24  GLUCOSE 195*  BUN 26*  CREATININE 1.18*  CALCIUM  9.8    GFR: Estimated Creatinine Clearance: 48.5 mL/min (A) (by C-G formula based on SCr of 1.18 mg/dL (H)).  Liver Function Tests: Recent Labs  Lab 11/12/24 0945  AST 23  ALT 21  ALKPHOS 96  BILITOT 0.4  PROT 6.6  ALBUMIN 4.1    Urine analysis:    Component Value Date/Time   COLORURINE STRAW (A) 11/12/2024 9053  APPEARANCEUR CLEAR 11/12/2024 0946   LABSPEC 1.017 11/12/2024 0946   PHURINE 7.0 11/12/2024 0946   GLUCOSEU >=500 (A) 11/12/2024 0946   HGBUR NEGATIVE 11/12/2024 0946   BILIRUBINUR NEGATIVE 11/12/2024 0946   KETONESUR NEGATIVE 11/12/2024 0946   PROTEINUR NEGATIVE 11/12/2024 0946   NITRITE NEGATIVE 11/12/2024 0946   LEUKOCYTESUR NEGATIVE 11/12/2024 0946    Radiological Exams on Admission: CT Head Wo Contrast Result Date: 11/12/2024 EXAM: CT HEAD WITHOUT 11/12/2024 10:27:43 AM TECHNIQUE: CT of the head was performed without the administration of intravenous contrast. Automated exposure control, iterative reconstruction, and/or weight based adjustment of the mA/kV was utilized to reduce the radiation dose to as low as reasonably achievable. COMPARISON: None available. CLINICAL HISTORY: Vertigo, peripheral. FINDINGS: BRAIN AND VENTRICLES: No acute intracranial hemorrhage. No mass effect or midline shift. No extra-axial fluid collection. No evidence of acute infarct. No hydrocephalus. Atherosclerotic calcifications are present within the cavernous internal carotid arteries. ORBITS: No acute abnormality. SINUSES AND MASTOIDS: No acute abnormality. SOFT TISSUES AND SKULL: No acute skull fracture. No acute soft tissue abnormality. IMPRESSION: 1. No acute intracranial abnormality. Electronically signed by: Morgane Naveau MD 11/12/2024 10:32 AM EST RP Workstation: [id number]   EKG: Independently reviewed.  Sinus tachycardia at 105 beats minute.  Nonspecific T wave changes.  Assessment/Plan Active Problems:    GERD   Essential hypertension with goal blood pressure less than 140/90   S/P mitral valve repair   S/P MVR (mitral valve replacement)   S/P AVR (aortic valve replacement)   Acquired hemolytic anemia (HCC)   Symptomatic anemia   Symptomatic anemia Hemolytic anemia Positive FOBT > Patient presenting with worsening dizziness, nausea, vomiting. > Found to have hemoglobin of 4.4, confirmed on repeat to be 4.6. > Noted to have LDH elevated to 391 in the setting of mitral valve and aortic valve replacements, both mechanical at [facility] in September. 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  Discussed with cardiothoracic surgeon at [facility] by EDP who stated was reasonable for patient to be admitted here for TTE and if that is normal, TEE.  The valvular abnormality that is significant is found then patient will need to be transferred back to [facility]. > FOBT is positive but patient reports no black stools or bloody stools.  Some intermittent dark brown stools.  There could be a slow GI bleed component given patient is on warfarin. - Monitor in progressive unit - Continue with 2 units PRBC transfusion - Trend CBC, continue further transfusion for hemoglobin less than 7 - Discussed with GI who will consult on patient tomorrow - Discussed with cardiology who do not currently need to be involved, but we will consult them when TEE is needed - Follow-up haptoglobin - Continue PPI - Add on iron, ferritin, folate, B12 - Transition from warfarin to heparin  while investigating GI bleed component, May be able to bridge/transition back to warfarin soon but will switch to heparin  overnight. - Supportive care  GERD - Continue PPI  Hypertension - Continue home metoprolol   DVT prophylaxis: Warfarin Code Status:   Full Family Communication:  Present at bedside Disposition Plan:   Patient is from:  Home  Anticipated DC to:  Home  Anticipated DC date:  2 to 7 days  Anticipated DC barriers: None  Consults  called:  EDP discussed case with patient's cardiothoracic surgeon at [facility], GI will see the patient in the morning, discussed with cardiology but they are not currently involved (reconsult for TEE when needed)   Admission status:  Inpatient,  progressive  Severity of Illness: The appropriate patient status for this patient is INPATIENT. Inpatient status is judged to be reasonable and necessary in order to provide the required intensity of service to ensure the patient's safety. The patient's presenting symptoms, physical exam findings, and initial radiographic and laboratory data in the context of their chronic comorbidities is felt to place them at high risk for further clinical deterioration. Furthermore, it is not anticipated that the patient will be medically stable for discharge from the hospital within 2 midnights of admission.   * I certify that at the point of admission it is my clinical judgment that the patient will require inpatient hospital care spanning beyond 2 midnights from the point of admission due to high intensity of service, high risk for further deterioration and high frequency of surveillance required.DEWAINE Marsa KATHEE Seena MD [facility]  How to contact the [facility] Attending or Consulting provider 7A - 7P or covering provider during after hours 7P -7A, for this patient?   Check the care team in [facility] and look for a) attending/consulting [facility] provider listed and b) the TRH team listed Log into [email or web address] and use [facility] universal password to access. If you do not have the password, please contact the hospital operator. Locate the TR[facility] provider you are looking for under [facility] and page to a number that you can be directly reached. If you still have difficulty reaching the provider, please page the [facility] (Director on Call) for the Hospitalists listed on amion for assistance.  11/12/2024, 3:16 PM       [1]  Allergies Allergen Reactions   Ace Inhibitors  Cough and Shortness Of Breath   Vancomycin Anaphylaxis and Other (See Comments)    Other Reaction: severe hypotension   Iodine    Latex    "

## 2024-11-12 NOTE — ED Notes (Signed)
 Pt assisted to use bedside commode and returned to bed without issue. Call bell in reach.

## 2024-11-12 NOTE — ED Provider Notes (Signed)
 " [place] EMERGENCY DEPARTMENT AT [facility] Provider Note   CSN: [id number] Arrival date & time: 11/12/24  9073     History Chief Complaint  Patient presents with   Dizziness   Emesis    HPI: Natasha Mcknight is a 59 y.o. 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 Ricky at [facility]) on warfarin, anemia, ovarian cysts, GERD, endometriosis, who presents complaining of dizziness, nausea. Patient arrived via POV accompanied by son.  History provided by patient.  No interpreter required during this encounter.  Patient reports that she has had progressive dizziness for approximately 3 days, reports that it is present at rest, however worsens with activity particularly standing.  Reports that she has also had nausea, vomiting, for the same 3 days.  Reports that the emesis is clear to yellow, denies any coffee-ground emesis, hematemesis.  Reports that she has had some dark brown stools, however no melena, hematochezia, denies any abdominal pain.  Reports that she has had chronic mild chest pain since undergoing her valve replacement, however does not have any new or different chest pain, shortness of breath, fevers, chills.  Denies prior history of GI bleed.  Reports that she has been adherent to her home warfarin.  Patient's recorded medical, surgical, social, medication list and allergies were reviewed in the Snapshot window as part of the initial history.   Prior to Admission medications  Medication Sig Start Date End Date Taking? Authorizing Provider  acetaminophen  (TYLENOL ) 500 MG tablet Take 500 mg by mouth every 6 (six) hours as needed for moderate pain (pain score 4-6).   Yes [provider]  aspirin 81 MG chewable tablet Chew 81 mg by mouth daily.   Yes [provider]  atorvastatin  (LIPITOR) 20 MG tablet Take 20 mg by mouth daily. 12/24/21  Yes [provider]  furosemide (LASIX) 20 MG tablet Take 20 mg by mouth as needed for  fluid. 09/29/24  Yes [provider]  JARDIANCE 25 MG TABS tablet Take 10 mg by mouth daily.   Yes [provider]  metoprolol  tartrate (LOPRESSOR ) 25 MG tablet Take 25 mg by mouth 2 (two) times daily. Patient taking differently: Take 12.5 mg by mouth 2 (two) times daily. 08/12/24  Yes [provider]  omeprazole (PRILOSEC) 20 MG capsule Take 20 mg by mouth daily.   Yes [provider]  oxyCODONE (OXY IR/ROXICODONE) 5 MG immediate release tablet Take 5 mg by mouth every 6 (six) hours as needed for severe pain (pain score 7-10).   Yes [provider]  OZEMPIC, 0.25 OR 0.5 MG/DOSE, 2 MG/3ML SOPN Inject 0.5 mg into the skin once a week. 07/23/23  Yes [provider]  senna-docusate (SENOKOT-S) 8.6-50 MG tablet Take 2 tablets by mouth daily as needed for mild constipation.   Yes [provider]  warfarin (COUMADIN) 5 MG tablet Take 5 mg by mouth daily.   Yes [provider]  zolpidem  (AMBIEN ) 10 MG tablet Take 10 mg by mouth at bedtime as needed.   Yes [provider]     Allergies: Ace inhibitors, Vancomycin, Iodine, and Latex   Review of Systems   ROS as per HPI  Physical Exam Updated Vital Signs BP 126/65   Pulse (!) 107   Temp 98.9 F (37.2 C) (Oral)   Resp 19   Ht 5' 4 (1.626 m)   Wt 65.8 kg   SpO2 100%   BMI 24.89 kg/m  Physical Exam Vitals and  nursing note reviewed. Exam conducted with a chaperone present.  Constitutional:      General: She is not in acute distress.    Appearance: She is well-developed.  HENT:     Head: Normocephalic and atraumatic.  Eyes:     Conjunctiva/sclera: Conjunctivae normal.  Cardiovascular:     Rate and Rhythm: Regular rhythm. Tachycardia present.     Heart sounds: No murmur heard. Pulmonary:     Effort: Pulmonary effort is normal. No respiratory distress.     Breath sounds: Normal breath sounds.  Abdominal:     Palpations: Abdomen is soft.     Tenderness: There  is no abdominal tenderness.  Genitourinary:    Rectum: Normal. Guaiac result positive.     Comments: No gross blood on DRE Musculoskeletal:        General: No swelling.     Cervical back: Neck supple.  Skin:    General: Skin is warm and dry.     Capillary Refill: Capillary refill takes less than 2 seconds.  Neurological:     Mental Status: She is alert.  Psychiatric:        Mood and Affect: Mood normal.     ED Course/ Medical Decision Making/ A&P   Procedures .Critical Care  Performed by: Rogelia Jerilynn RAMAN, MD Authorized by: Rogelia Jerilynn RAMAN, MD   Critical care provider statement:    Critical care time (minutes):  35   Critical care was necessary to treat or prevent imminent or life-threatening deterioration of the following conditions: Severe anemia requiring transfusion.   Critical care was time spent personally by me on the following activities:  Development of treatment plan with patient or surrogate, discussions with consultants, evaluation of patient's response to treatment, examination of patient, ordering and review of laboratory studies, ordering and review of radiographic studies, ordering and performing treatments and interventions, pulse oximetry, re-evaluation of patient's condition and review of old charts   I assumed direction of critical care for this patient from another provider in my specialty: no     Care discussed with: admitting provider      Medications Ordered in ED Medications  0.9 %  sodium chloride  infusion (Manually program via Guardrails IV Fluids) ( Intravenous New Bag/Given 11/12/24 1431)  pantoprazole  (PROTONIX ) injection 40 mg (40 mg Intravenous Given 11/12/24 1343)    Medical Decision Making:   Natasha Mcknight is a 59 y.o. female who presents for dizziness, emesis as per above.  Physical exam is pertinent for tachycardia.   The differential includes but is not limited to mechanical intravascular hemolysis (mechanical hemolytic anemia),  GI bleed, blood loss anemia, lab error, ACS, ICH.  Independent historian: None  External data reviewed: Labs: reviewed prior labs for baseline and Notes: Reviewed prior notes from [place].  Baseline hemoglobin during hospitalization at end of October at [facility], hemoglobin ranged from high 8s to low 10s.  Initial Plan:  Screening labs including CBC and Metabolic panel to evaluate for infectious or metabolic etiology of disease.  ESR and CRP placed in triage prior to my evaluation Urinalysis with reflex culture ordered to evaluate for UTI or relevant urologic/nephrologic pathology.  Fecal occult LDH, haptoglobin, to evaluate for hemolysis  ABO/Rh, type and screen to prepare for possible transfusion CT head to evaluate for structural/vascular intra-cranial pathology.  EKG and serial troponin to evaluate for cardiac pathology. Objective evaluation as below reviewed   Labs: Ordered, Independent interpretation, and Details: UA without UTI. CMP with creatinine at baseline.  No emergent  electro gentian or emergent LFT abnormality.  CRP WNL.  ESR mildly elevated at 35, nonspecific.  Troponin Mildly elevated at 27. CBC with no leukocytosis or thrombocytopenia, severe anemia to 4.4 from baseline of approximately 9 as of last October.  Repeat CBC redemonstration of anemia to 4.6..  LDH elevated at 391.  Haptoglobin pending at the time of admission.  Fecal occult positive.  Radiology: Ordered, Independent interpretation, Details: reviewed CTH, do not appreciate ICH, displaced fracture, loss of gray/white matter differentiation, mass lesion, MLS, and All images reviewed independently.  Agree with radiology report at this time.   CT Head Wo Contrast Result Date: 11/12/2024 EXAM: CT HEAD WITHOUT 11/12/2024 10:27:43 AM TECHNIQUE: CT of the head was performed without the administration of intravenous contrast. Automated exposure control, iterative reconstruction, and/or weight based adjustment of the mA/kV was utilized  to reduce the radiation dose to as low as reasonably achievable. COMPARISON: None available. CLINICAL HISTORY: Vertigo, peripheral. FINDINGS: BRAIN AND VENTRICLES: No acute intracranial hemorrhage. No mass effect or midline shift. No extra-axial fluid collection. No evidence of acute infarct. No hydrocephalus. Atherosclerotic calcifications are present within the cavernous internal carotid arteries. ORBITS: No acute abnormality. SINUSES AND MASTOIDS: No acute abnormality. SOFT TISSUES AND SKULL: No acute skull fracture. No acute soft tissue abnormality. IMPRESSION: 1. No acute intracranial abnormality. Electronically signed by: Morgane Naveau MD 11/12/2024 10:32 AM EST RP Workstation: [id number]    EKG/Medicine tests: Ordered and Independent interpretation EKG Interpretation: Sinus tachycardia Otherwise normal ECG No previous ECGs available Confirmed by Rogelia Satterfield ([id number]) on 11/12/2024 11:49:02 AM                 Interventions: Protonix , blood cell transfusion  See the EMR for full details regarding lab and imaging results.  Patient presents to the emergency department for dizziness, while patient was in triage she obtained screening labs as well as a CT head, CT head reassuring, however patient does have significant anemia significantly progressed from her most recent prior.  Patient reports possibly dark stool, however does not have any other recent trauma, no hematemesis, thus I also have concerns for possible hemolysis, potentially related to her heart valve particularly mechanical hemolytic anemia.  Will obtain LDH, haptoglobin to further evaluate for this as well as fecal occult and repeat CBC to ensure this was not a lab error.  Patient consented for blood.   Patient does not have any gross blood per rectum, does have fecal occult positive blood, possible that GI bleed is contributing to patient's presentation, thus administered Protonix , however I also have degree of concern for mechanical  hemolytic anemia.  Patient does have elevated LDH which would further support this.  Given anemia was redemonstrated on repeat hemoglobin and this was not a lab error, crossmatched for blood and blood ordered for transfusion.  I did consult cardiothoracic surgery, Dr. Ricky at [facility], who was the operating cardiothoracic surgeon.  Patient would like to stay at [facility] if possible.  Discussed presentation with Dr. Ricky and whether it was concerning for possible mechanical hemolytic anemia.  He is feels that it warrants further evaluation with a TTE, and if TTE is reassuring, consider TEE, and if there is abnormality with the valve on imaging, to reach back out to his service, however otherwise it is reasonable for patient to undergo initial evaluation and transfusion at [facility].  Thus, hospitalist were consulted for admission, spoke with Dr. Agapito with hospitalist who will come and evaluate the patient.  Presentation is most consistent  with acute complicated illness, Presentation is most consistent with acute life/limb-threatening illness, and Current presentation is complicated by underlying chronic conditions  Discussion of management or test interpretations with external provider(s): Dr. Seena, hospitalists  Risk Drugs:Prescription drug management Treatment: Decision regarding hospitalization Critical Care: 35-minute  Disposition: ADMIT: I believe the patient requires admission for further care and management. The patient was admitted to hospitalists. Please see inpatient provider note for additional treatment plan details.   MDM generated using voice dictation software and may contain dictation errors.  Please contact me for any clarification or with any questions.  Clinical Impression:  1. Anemia, unspecified type      Admit   Final Clinical Impression(s) / ED Diagnoses Final diagnoses:  Anemia, unspecified type    Rx / DC Orders ED Discharge Orders     None        Rogelia Jerilynn RAMAN,  MD 11/12/24 1508  "

## 2024-11-12 NOTE — ED Triage Notes (Addendum)
 Pt c.o dizziness and vomiting since Friday. Pt states she did have an episode of chest pain last night. Pt currently c.o headache. Pt has open heart surgery 3 months ago

## 2024-11-12 NOTE — Progress Notes (Addendum)
 ANTICOAGULATION CONSULT NOTE  Pharmacy Consult for Heparin  Indication: mechanical valve  Allergies[1]  Patient Measurements: Height: 5' 4 (162.6 cm) Weight: 65.8 kg (145 lb) IBW/kg (Calculated) : 54.7 Heparin  dosing weight: 65.8 kg  Vital Signs: Temp: 98.9 F (37.2 C) (01/04 1411) Temp Source: Oral (01/04 1411) BP: 126/65 (01/04 1500) Pulse Rate: 107 (01/04 1500)  Labs: Recent Labs    11/12/24 0945 11/12/24 1143  HGB 4.4* 4.6*  HCT 15.2* 16.3*  PLT 250 264  CREATININE 1.18*  --     Estimated Creatinine Clearance: 48.5 mL/min (A) (by C-G formula based on SCr of 1.18 mg/dL (H)).   Medical History: Past Medical History:  Diagnosis Date   Anemia    Clotting disorder    Endometriosis    Fibroids    GERD (gastroesophageal reflux disease)    Murmur, cardiac     Medications:  (Not in a hospital admission)  Scheduled:   [START ON 11/13/2024] atorvastatin   20 mg Oral Daily   metoprolol  tartrate  12.5 mg Oral BID   pantoprazole  (PROTONIX ) IV  40 mg Intravenous Q12H   sodium chloride  flush  3 mL Intravenous Q12H   Infusions:  PRN: acetaminophen  **OR** acetaminophen   Assessment: Natasha Mcknight with a history of HTN, GERD, s/p AVR on warfarin. Patient is presenting with symptomatic anemia. Heparin  per pharmacy consult placed for mechanical valve.  Patient taking warfarin prior to arrival. Home dose is 5mg  daily. Last taken 11/11/24.  PT / INR today is 31.7 / 2.9, which is therapeutic Hgb 4.6; plt 264  Goal of Therapy:  INR Goal 2.5-3.5 HL 0.3-0.7 Monitor platelets by anticoagulation protocol: Yes   Plan:  Begin heparin  gtt once INR < 2.5 F/u anemia work-up  Dorn Buttner, PharmD, BCPS 11/12/2024 3:36 PM ED Clinical Pharmacist -  [phone]        [1]  Allergies Allergen Reactions   Ace Inhibitors Cough and Shortness Of Breath   Vancomycin Anaphylaxis and Other (See Comments)    Other Reaction: severe hypotension   Iodine    Latex

## 2024-11-12 NOTE — ED Provider Triage Note (Signed)
 Emergency Medicine Provider Triage Evaluation Note  Natasha Mcknight , a 59 y.o. female  was evaluated in triage.  Pt complains of complaints of worsening dizziness along with nausea and vomiting since this past Friday.  Also has complaint of headache, history of cardiac valve replacement that required open chest surgery approximately 3 months ago.  Denies any vertigo stating that she feels more off balance but does not have a spinning sensation.    Review of Systems  Positive: As above Negative:   Physical Exam  BP (!) 121/55 (BP Location: Right Arm)   Pulse (!) 103   Temp 98.1 F (36.7 C)   Resp 18   Ht 5' 4 (1.626 m)   Wt 65.8 kg   SpO2 98%   BMI 24.89 kg/m  Gen:   Awake, no distress   Resp:  Normal effort  MSK:   Moves extremities without difficulty  Other:  Cranial nerves II through XII are intact, there is no focal weakness appreciated on the physical exam.  No nystagmus elicited.  No photophobia with ocular exam.  Medical Decision Making  Medically screening exam initiated at 9:46 AM.  Appropriate orders placed.  Natasha Mcknight was informed that the remainder of the evaluation will be completed by another provider, this initial triage assessment does not replace that evaluation, and the importance of remaining in the ED until their evaluation is complete.  Initial orders for labs and CT without contrast have been ordered, was considering order of CTA due to persistent and worsening dizziness however she does have an allergy to contrast and may require MRI.   Natasha Mcknight, Natasha Mcknight 11/12/24 [phone]

## 2024-11-13 ENCOUNTER — Inpatient Hospital Stay (HOSPITAL_COMMUNITY)

## 2024-11-13 ENCOUNTER — Encounter (HOSPITAL_COMMUNITY)
Admission: RE | Admit: 2024-11-13 | Discharge: 2024-11-13 | Disposition: A | Discharge status: Home / Self Care | Source: Ambulatory Visit | Attending: Cardiology | Admitting: Cardiology

## 2024-11-13 DIAGNOSIS — D509 Iron deficiency anemia, unspecified: Secondary | ICD-10-CM | POA: Diagnosis not present

## 2024-11-13 DIAGNOSIS — I080 Rheumatic disorders of both mitral and aortic valves: Secondary | ICD-10-CM

## 2024-11-13 DIAGNOSIS — K219 Gastro-esophageal reflux disease without esophagitis: Secondary | ICD-10-CM

## 2024-11-13 DIAGNOSIS — Z7901 Long term (current) use of anticoagulants: Secondary | ICD-10-CM

## 2024-11-13 DIAGNOSIS — I059 Rheumatic mitral valve disease, unspecified: Secondary | ICD-10-CM | POA: Diagnosis not present

## 2024-11-13 DIAGNOSIS — I10 Essential (primary) hypertension: Secondary | ICD-10-CM

## 2024-11-13 DIAGNOSIS — I5032 Chronic diastolic (congestive) heart failure: Secondary | ICD-10-CM

## 2024-11-13 DIAGNOSIS — K922 Gastrointestinal hemorrhage, unspecified: Secondary | ICD-10-CM

## 2024-11-13 DIAGNOSIS — Z9889 Other specified postprocedural states: Secondary | ICD-10-CM | POA: Diagnosis not present

## 2024-11-13 DIAGNOSIS — I429 Cardiomyopathy, unspecified: Secondary | ICD-10-CM

## 2024-11-13 DIAGNOSIS — Z952 Presence of prosthetic heart valve: Secondary | ICD-10-CM

## 2024-11-13 DIAGNOSIS — D649 Anemia, unspecified: Secondary | ICD-10-CM | POA: Diagnosis not present

## 2024-11-13 LAB — CBC
HCT: 22.1 % — ABNORMAL LOW (ref 36.0–46.0)
HCT: 27.1 % — ABNORMAL LOW (ref 36.0–46.0)
Hemoglobin: 7.2 g/dL — ABNORMAL LOW (ref 12.0–15.0)
Hemoglobin: 8.8 g/dL — ABNORMAL LOW (ref 12.0–15.0)
MCH: 27.3 pg (ref 26.0–34.0)
MCH: 28.1 pg (ref 26.0–34.0)
MCHC: 32.6 g/dL (ref 30.0–36.0)
MCHC: 32.5 g/dL (ref 30.0–36.0)
MCV: 83.7 fL (ref 80.0–100.0)
MCV: 86.6 fL (ref 80.0–100.0)
Platelets: 234 K/uL (ref 150–400)
Platelets: 223 K/uL (ref 150–400)
RBC: 2.64 MIL/uL — ABNORMAL LOW (ref 3.87–5.11)
RBC: 3.13 MIL/uL — ABNORMAL LOW (ref 3.87–5.11)
RDW: 16.1 % — ABNORMAL HIGH (ref 11.5–15.5)
RDW: 16 % — ABNORMAL HIGH (ref 11.5–15.5)
WBC: 8.9 K/uL (ref 4.0–10.5)
WBC: 8.5 K/uL (ref 4.0–10.5)
nRBC: 0.2 % (ref 0.0–0.2)
nRBC: 0.5 % — ABNORMAL HIGH (ref 0.0–0.2)

## 2024-11-13 LAB — ECHOCARDIOGRAM COMPLETE BUBBLE STUDY
AR max vel: 0.65 cm2
AV Area VTI: 0.65 cm2
AV Area mean vel: 0.66 cm2
AV Mean grad: 12 mmHg
AV Peak grad: 21.7 mmHg
Ao pk vel: 2.33 m/s
MV VTI: 0.68 cm2
S' Lateral: 2.8 cm

## 2024-11-13 LAB — CBC WITH DIFFERENTIAL/PLATELET
Abs Immature Granulocytes: 0.05 K/uL (ref 0.00–0.07)
Basophils Absolute: 0 K/uL (ref 0.0–0.1)
Basophils Relative: 0 %
Eosinophils Absolute: 0.1 K/uL (ref 0.0–0.5)
Eosinophils Relative: 1 %
HCT: 21.7 % — ABNORMAL LOW (ref 36.0–46.0)
Hemoglobin: 7 g/dL — ABNORMAL LOW (ref 12.0–15.0)
Immature Granulocytes: 1 %
Lymphocytes Relative: 21 %
Lymphs Abs: 1.9 K/uL (ref 0.7–4.0)
MCH: 27.2 pg (ref 26.0–34.0)
MCHC: 32.3 g/dL (ref 30.0–36.0)
MCV: 84.4 fL (ref 80.0–100.0)
Monocytes Absolute: 0.8 K/uL (ref 0.1–1.0)
Monocytes Relative: 9 %
Neutro Abs: 6.2 K/uL (ref 1.7–7.7)
Neutrophils Relative %: 68 %
Platelets: 223 K/uL (ref 150–400)
RBC: 2.57 MIL/uL — ABNORMAL LOW (ref 3.87–5.11)
RDW: 16.3 % — ABNORMAL HIGH (ref 11.5–15.5)
WBC: 9 K/uL (ref 4.0–10.5)
nRBC: 0 % (ref 0.0–0.2)

## 2024-11-13 LAB — COMPREHENSIVE METABOLIC PANEL WITH GFR
ALT: 17 U/L (ref 0–44)
AST: 20 U/L (ref 15–41)
Albumin: 3.8 g/dL (ref 3.5–5.0)
Alkaline Phosphatase: 68 U/L (ref 38–126)
Anion gap: 9 (ref 5–15)
BUN: 23 mg/dL — ABNORMAL HIGH (ref 6–20)
CO2: 25 mmol/L (ref 22–32)
Calcium: 8.6 mg/dL — ABNORMAL LOW (ref 8.9–10.3)
Chloride: 105 mmol/L (ref 98–111)
Creatinine, Ser: 1.14 mg/dL — ABNORMAL HIGH (ref 0.44–1.00)
GFR, Estimated: 56 mL/min — ABNORMAL LOW
Glucose, Bld: 107 mg/dL — ABNORMAL HIGH (ref 70–99)
Potassium: 3.9 mmol/L (ref 3.5–5.1)
Sodium: 139 mmol/L (ref 135–145)
Total Bilirubin: 0.7 mg/dL (ref 0.0–1.2)
Total Protein: 5.9 g/dL — ABNORMAL LOW (ref 6.5–8.1)

## 2024-11-13 LAB — PROTIME-INR
INR: 3 — ABNORMAL HIGH (ref 0.8–1.2)
Prothrombin Time: 32.7 s — ABNORMAL HIGH (ref 11.4–15.2)

## 2024-11-13 LAB — PREPARE RBC (CROSSMATCH)

## 2024-11-13 LAB — HAPTOGLOBIN: Haptoglobin: 91 mg/dL (ref 33–346)

## 2024-11-13 MED ORDER — ORAL CARE MOUTH RINSE: 15.0000 mL | OROMUCOSAL | Status: DC | PRN

## 2024-11-13 MED ORDER — SODIUM CHLORIDE 0.9% IV SOLUTION: Freq: Once | INTRAVENOUS | Status: DC

## 2024-11-13 MED ORDER — MORPHINE SULFATE (PF) 2 MG/ML IV SOLN
2.0000 mg | INTRAVENOUS | Status: AC
  Administered 2024-11-13: 2 mg via INTRAVENOUS
  Filled 2024-11-13: qty 1

## 2024-11-13 NOTE — ED Notes (Signed)
" °   11/13/24 0814 11/13/24 0820 11/13/24 0825  Vitals  Temp 97.7 F (36.5 C) 97.8 F (36.6 C) 97.7 F (36.5 C)  Temp Source Oral Oral Oral  BP (!) 115/58 (!) 112/57 111/64  MAP (mmHg) 74 74 79  Pulse Rate 100 99 98  ECG Heart Rate 100 100 99  Resp 18 20 19   MEWS COLOR  MEWS Score Color Landy Landy Green  Oxygen Therapy  SpO2 100 % 100 % 100 %  O2 Device  --   --  Room Air    11/13/24 0830  Vitals  Temp 97.8 F (36.6 C)  Temp Source Oral  BP 114/75  MAP (mmHg) 83  Pulse Rate 99  ECG Heart Rate 92  Resp 18  MEWS COLOR  MEWS Score Color Green  Oxygen Therapy  SpO2 100 %  O2 Device Room Air   FIRST 15 BLOOD ADMIN "

## 2024-11-13 NOTE — Consult Note (Addendum)
 "  Cardiology Consultation  Patient ID: Natasha Mcknight MRN: [id number]; DOB: 1966-01-08  Admit date: 11/12/2024 Date of Consult: 11/13/2024  PCP:  Vernon Velna SAUNDERS, MD   [facility] Providers Cardiologist:  CONWAY, BENJAMIN JOHN, MD   at [facility]  Patient Profile: Natasha Mcknight is a 59 y.o. 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 Trixie.  History of Present Illness: Ms. Jamie has past medical history as stated above.  She presented to the [facility] emergency department on 11/12/2024 complaining of dizziness/vomiting since Friday.  She reported increasing dizziness, worse with standing and activity, nausea and vomiting x 3 days.  She did note that she was having darker stools than normal.   Relevant workup in the ED includes: Hemoglobin on arrival 4.4, INR 2.9-3.0, metabolic panel unremarkable, troponin 27, positive FOBT. Head CT showed no acute abnormality. Pending updated echocardiogram.   She was given 2 units of PRBCs, planning on starting a third this morning.  Follow-up hemoglobin after 2 units of PRBCs with 7, up from 4.4.  Followed closely through to cardiology, who performed valve replacements in September 2025.  She was last seen by their system 09/15/2024 for follow-up.  She had been recently admitted to the hospital 10/23 - 09/04/2024 for subtherapeutic INR 1.2.  At that time she had been placed on IV heparin  until her INR improved, most recently noted to be 2.6 at her PCP office following that hospitalization.  Otherwise, she was doing well at this appointment, with plans to perform cardiac rehab through [facility] facilities, and was recommended to follow-up with [facility] cardiology in 6  months or as needed.  Of note, ER provider spoke with [facility] cardiothoracic surgery who recommended TTE/potential TEE, noting that if there are any issues with the valve to reach back out for potential transfer to [facility] facilities.  She is been admitted to medicine service, she has been consulted in the setting of GI bleed, symptomatic anemia.  Cardiology has been consulted for further recommendations in regards to anticoagulation with mechanical valves.  GI is planning on doing an EGD, this will be dependent on INR is low enough to perform the procedure safely.  I also recommend a colonoscopy however not sure if this will be done inpatient vs outpatient.  After speaking with the patient, she agrees with the history as stated above. She tells me that she was feeling pretty good for the last few weeks but starting Friday she began feeling dizzy/lightheaded. She tells me that she realized she was having darker stools once she was asked about them   Past Medical History:  Diagnosis Date   Anemia    Clotting disorder    Endometriosis    Fibroids    GERD (gastroesophageal reflux disease)    Murmur, cardiac    Past Surgical History:  Procedure Laterality Date   LAPAROSCOPIC SALPINGOOPHERECTOMY  1982   left endodermal sinus tumor   mitral valvle repair  03/2009   [facility]-Dr. Liana   MYOMECTOMY ABDOMINAL APPROACH  1991   OVARIAN CYST REMOVAL  1991   stomach tumor removal  1985   TOTAL ABDOMINAL HYSTERECTOMY W/ BILATERAL SALPINGOOPHORECTOMY  2006   chronic pelvic pain    Home Medications:  Prior to Admission  medications  Medication Sig Start Date End Date Taking? Authorizing Provider  acetaminophen  (TYLENOL ) 500 MG tablet Take 500 mg by mouth every 6 (six) hours as needed for moderate pain (pain score 4-6).   Yes [provider]  aspirin 81 MG chewable tablet Chew 81 mg by mouth daily.   Yes [provider]  atorvastatin  (LIPITOR) 20 MG tablet Take 20 mg by mouth daily.  12/24/21  Yes [provider]  furosemide (LASIX) 20 MG tablet Take 20 mg by mouth as needed for fluid. 09/29/24  Yes [provider]  JARDIANCE 25 MG TABS tablet Take 10 mg by mouth daily.   Yes [provider]  metoprolol  tartrate (LOPRESSOR ) 25 MG tablet Take 25 mg by mouth 2 (two) times daily. Patient taking differently: Take 12.5 mg by mouth 2 (two) times daily. 08/12/24  Yes [provider]  omeprazole (PRILOSEC) 20 MG capsule Take 20 mg by mouth daily.   Yes [provider]  oxyCODONE (OXY IR/ROXICODONE) 5 MG immediate release tablet Take 5 mg by mouth every 6 (six) hours as needed for severe pain (pain score 7-10).   Yes [provider]  OZEMPIC, 0.25 OR 0.5 MG/DOSE, 2 MG/3ML SOPN Inject 0.5 mg into the skin once a week. 07/23/23  Yes [provider]  senna-docusate (SENOKOT-S) 8.6-50 MG tablet Take 2 tablets by mouth daily as needed for mild constipation.   Yes [provider]  warfarin (COUMADIN) 5 MG tablet Take 5 mg by mouth daily.   Yes [provider]  zolpidem  (AMBIEN ) 10 MG tablet Take 10 mg by mouth at bedtime as needed.   Yes [provider]    Scheduled Meds:  sodium chloride    Intravenous Once   atorvastatin   20 mg Oral Daily   metoprolol  tartrate  12.5 mg Oral BID   pantoprazole  (PROTONIX ) IV  40 mg Intravenous Q12H   sodium chloride  flush  3 mL Intravenous Q12H   Continuous Infusions:  PRN Meds: acetaminophen  **OR** acetaminophen , zolpidem   Allergies:   Allergies[1]  Social History:   Social History   Socioeconomic History   Marital status: Divorced    Spouse name: Not on file   Number of children: Not on file   Years of education: Not on file   Highest education level: Not on file  Occupational History   Not on file  Tobacco Use   Smoking status: Never   Smokeless tobacco: Not on file  Substance and Sexual Activity   Alcohol use: Not on file   Drug use: Not on file    Sexual activity: Not on file  Other Topics Concern   Not on file  Social History Narrative   Not on file   Social Drivers of Health   Tobacco Use: Unknown (11/12/2024)   Patient History    Smoking Tobacco Use: Never    Smokeless Tobacco Use: Unknown    Passive Exposure: Not on file  Financial Resource Strain: Low Risk  (09/05/2024)   Received from [facility]   Overall Financial Resource Strain (CARDIA)    Difficulty of Paying Living Expenses: Not hard at all  Food Insecurity: No Food Insecurity (11/13/2024)   Epic    Worried About Radiation Protection Practitioner of Food in the Last Year: Never true    Ran Out of Food in the Last Year: Never true  Transportation Needs: No Transportation Needs (11/13/2024)   Epic    Lack of Transportation (Medical): No    Lack  of Transportation (Non-Medical): No  Physical Activity: Not on file  Stress: Not on file  Social Connections: Not on file  Intimate Partner Violence: Not At Risk (11/13/2024)   Epic    Fear of Current or Ex-Partner: No    Emotionally Abused: No    Physically Abused: No    Sexually Abused: No  Depression (PHQ2-9): Low Risk (10/12/2024)   Depression (PHQ2-9)    PHQ-2 Score: 3  Alcohol Screen: Not on file  Housing: Low Risk (11/13/2024)   Epic    Unable to Pay for Housing in the Last Year: No    Number of Times Moved in the Last Year: 0    Homeless in the Last Year: No  Utilities: Not At Risk (11/13/2024)   Epic    Threatened with loss of utilities: No  Health Literacy: Not on file    Family History:   Family History  Problem Relation Age of Onset   Cancer Maternal Grandmother    Hypertension Father    Diabetes Mother    Renal Disease Mother     ROS:  Please see the history of present illness.  All other ROS reviewed and negative.     Physical Exam/Data: Vitals:   11/13/24 0825 11/13/24 0830 11/13/24 1035 11/13/24 1330  BP: 111/64 114/75 115/63 125/68  Pulse: 98 99 99 (!) 101  Resp: 19 18 20 16   Temp: 97.7 F  (36.5 C) 97.8 F (36.6 C) 98 F (36.7 C)   TempSrc: Oral Oral Oral   SpO2: 100% 100% 100% 100%  Weight:      Height:        Intake/Output Summary (Last 24 hours) at 11/13/2024 1340 Last data filed at 11/13/2024 1034 Gross per 24 hour  Intake 764.67 ml  Output --  Net 764.67 ml      11/12/2024    9:33 AM 10/12/2024    4:49 PM 09/23/2012    1:48 PM  Last 3 Weights  Weight (lbs) 145 lb 150 lb 9.2 oz 155 lb  Weight (kg) 65.772 kg 68.3 kg 70.308 kg     Body mass index is 24.89 kg/m.   General:  in no acute distress, resting comfortably on room air HEENT: normal Neck: no JVD Vascular: No carotid bruits; Distal pulses 2+ bilaterally Cardiac:  mechanical clicking, no murmur   Lungs:  clear to auscultation bilaterally, no wheezing, rhonchi or rales  Abd: soft, nontender, no hepatomegaly  Ext: no edema Musculoskeletal:  No deformities, BUE and BLE strength normal and equal Skin: warm and dry  Neuro:  no focal abnormalities noted Psych:  Normal affect   EKG:  The EKG was personally reviewed and demonstrates: Sinus tachycardia, HR 105, no acute ischemic changes  Telemetry:  Telemetry was personally reviewed and demonstrates:  sinus, HR 90s  Relevant CV Studies:  Transesophageal echocardiogram, 08/08/2024 at [facility] Pre-procedure:  Normal biventricular systolic function  Trace tricuspid regurgitation  Moderate mitral stenosis, mild mitral regurgitation  Severe aortic regurgitation, mild aortic stenosis  No PFO   Post-procedure LV systolic function slightly decreased compared to prior (45%), mild RV dysfunction  Hypokinesis of inferior and septal walls  S/p 19 mm Abbott Regent mechanical valve replacement. Aortic valve opens well, is well-seated without PVL, washing jets visualized. Mean gradient 10 mm Hg  Mitral valve opens well, is well-seated without PVL, washing jets visualized. Mean gradient 6   Pre-TAVR CT, 06/05/2024 at [facility] 1. Trileaflet aortic valve that is thickened and  without calcifications.  No  aortic annular or LVOT calcifications  Normal LV size and function. Normal size ascending thoracic aorta with mild mural calcifications at  the sinotubular junction.  Patent thoracoabdominal aorta and iliofemoral vessels with no stenosis. High bifurcation of the right common femoral artery to the SFA at the pelvic rim.   Cardiac cath, 06/02/2024 at [facility] Normal RH filling pressure (RAP ) with elevated LH filling pressures (PCWP ).  Mild pulmonary hypertension (PVR 3.2WU) with preserved cardiac index.  No significant CAD.   Laboratory Data: High Sensitivity Troponin:  No results for input(s): TROPONINIHS in the last 720 hours.  Recent Labs  Lab 11/12/24 1143  TRNPT 27*      Chemistry Recent Labs  Lab 11/12/24 0945 11/13/24 0431  NA 141 139  K 3.5 3.9  CL 104 105  CO2 24 25  GLUCOSE 195* 107*  BUN 26* 23*  CREATININE 1.18* 1.14*  CALCIUM  9.8 8.6*  GFRNONAA 53* 56*  ANIONGAP 13 9    Recent Labs  Lab 11/12/24 0945 11/13/24 0431  PROT 6.6 5.9*  ALBUMIN 4.1 3.8  AST 23 20  ALT 21 17  ALKPHOS 96 68  BILITOT 0.4 0.7   Lipids No results for input(s): CHOL, TRIG, HDL, LABVLDL, LDLCALC, CHOLHDL in the last 168 hours.  Hematology Recent Labs  Lab 11/13/24 0431 11/13/24 0650 11/13/24 1308  WBC 8.9 9.0 8.5  RBC 2.64* 2.57* 3.13*  HGB 7.2* 7.0* 8.8*  HCT 22.1* 21.7* 27.1*  MCV 83.7 84.4 86.6  MCH 27.3 27.2 28.1  MCHC 32.6 32.3 32.5  RDW 16.1* 16.3* 16.0*  PLT 234 223 223   Thyroid No results for input(s): TSH, FREET4 in the last 168 hours.  BNPNo results for input(s): BNP, PROBNP in the last 168 hours.  DDimer No results for input(s): DDIMER in the last 168 hours.  Radiology/Studies:  CT Head Wo Contrast Result Date: 11/12/2024 EXAM: CT HEAD WITHOUT 11/12/2024 10:27:43 AM TECHNIQUE: CT of the head was performed without the administration of intravenous contrast. Automated exposure control, iterative  reconstruction, and/or weight based adjustment of the mA/kV was utilized to reduce the radiation dose to as low as reasonably achievable. COMPARISON: None available. CLINICAL HISTORY: Vertigo, peripheral. FINDINGS: BRAIN AND VENTRICLES: No acute intracranial hemorrhage. No mass effect or midline shift. No extra-axial fluid collection. No evidence of acute infarct. No hydrocephalus. Atherosclerotic calcifications are present within the cavernous internal carotid arteries. ORBITS: No acute abnormality. SINUSES AND MASTOIDS: No acute abnormality. SOFT TISSUES AND SKULL: No acute skull fracture. No acute soft tissue abnormality. IMPRESSION: 1. No acute intracranial abnormality. Electronically signed by: Morgane Naveau MD 11/12/2024 10:32 AM EST RP Workstation: [id number]   Assessment and Plan:  Moderate MS, mild MR Severe AR, mild AS s/p mechanical AVR/MVR on 08/08/24 at [facility] Indefinite anticoagulation in the setting of mechanical valves Post procedure TEE 07/2024: abbott Regent mechanical valve replacement. Aortic valve opens well, is well-seated without PVL, washing jets visualized. Mean gradient 10 mm Hg.  Mitral valve opens well, is well-seated without PVL, washing jets visualized. Mean gradient 6  Started on warfarin post mechanical valve replacement Admitted in 08/2024 for subtherapeutic INR, required IV heparin  INR goal 2.5-3.5 Presented with INR in therapeutic range Hemoglobin this admission 4.4 on arrival, given 3 units PRBCs with improvement to 7 Warfarin held, anticipate that she will likely need IV heparin  once INR at 2.5 Seen by GI, plans for EGD this admission and possible colonoscopy inpatient vs outpatient [facility] cardiology aware, discussed possible transfer if issue with  valves Echocardiogram pending, will follow results  Okay to hold coumadin for now given acute bleeding/symptomatic anemia and continue to monitor INR, will need to give IV heparin  once INR at 2.5, will discuss with  MD  Chronic HFmrEF Hypertension  Followed closely at [facility] cardiology With EF as low as 35 to 45%, most recently noted to be 45% on TEE 07/2024 Home meds: Lasix 20 mg as needed, Lopressor  12.5 mg twice daily, Jardiance 10 mg daily Euvolemic on exam  Continued on PTA Lopressor    Per primary Symptomatic anemia Acute GI bleed CKD stage IIIa Type 2 diabetes Hyperlipidemia  Risk Assessment/Risk Scores:       [place]  Heart Association (NYHA) Functional Class NYHA Class I    For questions or updates, please contact [facility] Please consult [email or web address] for contact info under   Signed, Waddell DELENA Donath, PA-C  11/13/2024 1:40 PM  Gailen Pouch was seen by me today along with Waddell Donath, PA-C. I have personally performed an evaluation on this patient.  My findings are as follows: 59 y.o. female with history of HTN, HLD, cardiomyopathy and severe AS/MR s/p recent surgical AVR/MVR with mechanical valves. She is on coumadin chronically. She is admitted with weakness and fatigue and found to have Hgb 4.4 due to GI bleeding. INR around 3.0.   Data: EKG(s) and pertinent labs, studies, etc were personally reviewed and interpreted by me:  EKG from yesterday with sinus tach Tele: sinus Otherwise, I agree with data as outlined by the advanced practice provider.  Exam performed by me: Gen: NAD Neck: No JVD Cardiac: RRR Lungs: Clear bilat Extremities: No LE edema  My Assessment and Plan:  Aortic/Mitral valve disease s/p MVR/AVR: She is on coumadin chronically. Goal INR above 2.5. She comes in with anemia and GI bleeding. No evidence of hemolysis. Coumadin is on hold.  -would start IV heparin  when INR under 2.5 given mechanical mitral valve replacement Echo pending per request of cardiac team at [facility]   GI bleeding/Anemia: Pt is on chronic coumadin therapy. GI workup pending. She is post 2 units of pRBCs.    Signed,  Lonni Cash, MD  11/13/2024 1:43  PM       [1]  Allergies Allergen Reactions   Ace Inhibitors Cough and Shortness Of Breath   Vancomycin Anaphylaxis and Other (See Comments)    Other Reaction: severe hypotension   Iodine    Latex    "

## 2024-11-13 NOTE — Consult Note (Addendum)
 "                                               Consultation Note   Referring Provider:   [facility] PCP: Vernon Velna SAUNDERS, MD Primary Gastroenterologist:  Lupita Commander, MD      Reason for Consultation: Severe anemia/ FOBT+ DOA: 11/12/2024         Hospital Day: 2   Assessment and Plan:  59 year old female with symptomatic iron deficiency anemia / Dark stools , FOBT + Labs reviewed in Care Everywhere. Hgb 13 late September 2025.  She had cardiac valve repair in September and follow-up labs the end of October show a hemoglobin of 9.7 .  She presents this admission with a hemoglobin of 4.4.  LDH elevated so anemia may be due in part to hemolysis in the setting of recent MVR/AVR but also slow GI bleeding on warfarin. Had some recent epigastric burning and nausea / vomiting though N/V may have been 2/2 to lightheadedness Hemoglobin improved to 7 following 2 units RBCs .  Received a 3rd unit this am, follow-up H&H not yet done .  Needs EGD given dark stool. She is transitioning from warfarin to IV heparin .  INR is 3.0 today so not sure she will be ready for EGD by tomorrow. The risks and benefits of EGD with possible biopsies were discussed with the patient who agrees to proceed.  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 On Warfarin. Developed weakness / lightheadedness and nausea a few days ago. Vomited yesterday ( non- bloody emesis). Last week she has some epigastric burning after taking what sounds like Tums. Has noticed over the last week that stools have been darker brown than normal. She hasn't seen any red blood in her stool. She has been constipated but taking something for GERD with calcium . Takes a daily baby asa, no other NSAIDS. Takes daily Omeprazole but still needs OTC ( ? Tums) for breakthrough symptoms.    Admission studies / evaluation notable for:   In ED she was tachycardic, BP slightly low WBC 7.4, Hgb 4.4, MCV 83, BUN 26, Cr 1.18, LFTs normal, LDH 391, High sensitivity Troponin 27, ferritin 11, TIBC 340, 8 % iron sat  Received 2 units RBC, hgb 7, a 3rd unit given this am. Follow up H/H not yet done  Recent Labs    11/12/24 0945 11/13/24 0431  PROT 6.6 5.9*  ALBUMIN 4.1 3.8  AST 23 20  ALT 21 17  ALKPHOS 96 68  BILITOT 0.4 0.7   Recent Labs    11/12/24 2118 11/13/24 0431 11/13/24 0650  WBC 9.2 8.9 9.0  HGB 7.3* 7.2* 7.0*  HCT 22.8* 22.1* 21.7*  MCV 85.7 83.7 84.4  PLT 236 234 223   Recent Labs    11/12/24 0945 11/13/24 0431  NA 141 139  K 3.5 3.9  CL 104 105  CO2 24 25  GLUCOSE 195* 107*  BUN 26* 23*  CREATININE 1.18* 1.14*  CALCIUM  9.8 8.6*      Prior Endoscopic Evaluations:   Patient thinks she has had a colonoscopy ~ 8 year ago but cannot give any details.    Review of Systems: All systems reviewed and negative except where noted in HPI.  Physical Exam: Vital signs in last 24 hours: Temp:  [97.7 F (36.5 C)-98.9 F (37.2 C)] 98 F (36.7 C) (01/05 1035) Pulse Rate:  [96-110] 99 (01/05 1035) Resp:  [14-26] 20 (01/05  1035) BP: (110-144)/(52-75) 115/63 (01/05 1035) SpO2:  [99 %-100 %] 100 % (01/05 1035)   General:  Pleasant female in NAD Psych:  Cooperative. Normal mood and affect Eyes: Pupils equal Ears:  Normal auditory acuity Nose: No deformity, discharge or lesions Neck:  Supple, no masses felt Lungs:  Clear to auscultation.  Heart:  Regular rate, regular rhythm. Mechanical valve click Abdomen:  Soft, nondistended, nontender, active bowel sounds, no masses felt Rectal :  Deferred Msk: Symmetrical without gross deformities.  Neurologic:  Alert, oriented, grossly normal neurologically Extremities : No edema Skin:  Intact without significant lesions.   OUTPATIENT MEDICATIONS Prior to Admission medications  Medication Sig Start Date End Date Taking? Authorizing Provider  acetaminophen  (TYLENOL ) 500 MG tablet Take 500 mg by mouth every 6 (six) hours as needed for moderate pain (pain score 4-6).   Yes [provider]  aspirin 81 MG chewable tablet Chew 81 mg by mouth daily.   Yes [provider]  atorvastatin  (LIPITOR) 20 MG tablet Take 20 mg by mouth daily. 12/24/21  Yes [provider]  furosemide (LASIX) 20 MG tablet Take 20 mg by mouth as needed for fluid. 09/29/24  Yes [provider]  JARDIANCE 25 MG TABS tablet Take 10 mg by mouth daily.   Yes [provider]  metoprolol  tartrate (LOPRESSOR ) 25 MG tablet Take 25 mg by mouth 2 (two) times daily. Patient taking differently: Take 12.5 mg by mouth 2 (two) times daily. 08/12/24  Yes [provider]  omeprazole (PRILOSEC) 20 MG capsule Take 20 mg by mouth daily.   Yes [provider]  oxyCODONE (OXY IR/ROXICODONE) 5 MG immediate release tablet Take 5 mg by mouth every 6 (six) hours as needed for severe pain (pain score 7-10).   Yes [provider]  OZEMPIC, 0.25 OR 0.5 MG/DOSE, 2 MG/3ML SOPN Inject 0.5 mg into the skin once a week. 07/23/23  Yes [provider]   senna-docusate (SENOKOT-S) 8.6-50 MG tablet Take 2 tablets by mouth daily as needed for mild constipation.   Yes [provider]  warfarin (COUMADIN) 5 MG tablet Take 5 mg by mouth daily.   Yes [provider]  zolpidem  (AMBIEN ) 10 MG tablet Take 10 mg by mouth at bedtime as needed.   Yes [provider]    Allergies as of 11/12/2024 - Review Complete 11/12/2024  Allergen Reaction Noted   Ace inhibitors Cough and Shortness Of Breath 09/15/2024   Vancomycin Anaphylaxis and Other (See Comments) 11/12/2024   Iodine  02/06/2009   Latex  09/23/2012    INPATIENT MEDICATIONS Current Facility-Administered Medications  Medication Dose Route Frequency Provider Last Rate Last Admin   0.9 %  sodium chloride  infusion (Manually program via Guardrails IV Fluids)   Intravenous Once Gherghe, Costin M, MD       acetaminophen  (TYLENOL ) tablet 650 mg  650 mg Oral Q6H PRN Melvin, Alexander B, MD       Or   acetaminophen  (TYLENOL ) suppository 650 mg  650 mg Rectal Q6H PRN Seena Marsa NOVAK, MD       atorvastatin  (LIPITOR) tablet 20 mg  20 mg Oral Daily Melvin, Alexander B, MD   20 mg at 11/13/24 1000   metoprolol  tartrate (LOPRESSOR ) tablet 12.5 mg  12.5 mg Oral BID Melvin, Alexander B, MD   12.5 mg at 11/13/24 1000   pantoprazole  (PROTONIX ) injection 40 mg  40 mg Intravenous Q12H Melvin, Alexander B, MD   40 mg at 11/13/24 1000   sodium chloride  flush (NS) 0.9 % injection 3 mL  3 mL Intravenous Q12H Seena Marsa NOVAK, MD   3 mL at 11/12/24 2134   zolpidem  (AMBIEN ) tablet 5 mg  5 mg Oral QHS PRN Seena Marsa NOVAK, MD       Current Outpatient Medications  Medication Sig Dispense Refill   acetaminophen  (TYLENOL ) 500 MG tablet Take 500 mg by mouth every 6 (six) hours as needed for moderate pain (pain score 4-6).     aspirin 81 MG chewable tablet Chew 81 mg by mouth daily.     atorvastatin  (LIPITOR) 20 MG tablet Take 20 mg by mouth daily.     furosemide (LASIX) 20 MG tablet  Take 20 mg by mouth as needed for fluid.     JARDIANCE 25 MG TABS tablet Take 10 mg by mouth daily.     metoprolol  tartrate (LOPRESSOR ) 25 MG tablet Take 25 mg by mouth 2 (two) times daily. (Patient taking differently: Take 12.5 mg by mouth 2 (two) times daily.)     omeprazole (PRILOSEC) 20 MG capsule Take 20 mg by mouth daily.     oxyCODONE (OXY IR/ROXICODONE) 5 MG immediate release tablet Take 5 mg by mouth every 6 (six) hours as needed for severe pain (pain score 7-10).     OZEMPIC, 0.25 OR 0.5 MG/DOSE, 2 MG/3ML SOPN Inject 0.5 mg into the skin once a week.     senna-docusate (SENOKOT-S) 8.6-50 MG tablet Take 2 tablets by mouth daily as needed for mild constipation.     warfarin (COUMADIN) 5 MG tablet Take 5 mg by mouth daily.     zolpidem  (AMBIEN ) 10 MG tablet Take 10 mg by mouth at bedtime as needed.       Past Medical History:  Diagnosis Date   Anemia    Clotting disorder    Endometriosis    Fibroids    GERD (gastroesophageal reflux disease)    Murmur, cardiac     Past Surgical History:  Procedure Laterality Date   LAPAROSCOPIC SALPINGOOPHERECTOMY  1982   left endodermal sinus tumor   mitral valvle repair  03/2009   [facility]-Dr. Liana   MYOMECTOMY ABDOMINAL APPROACH  1991   OVARIAN CYST REMOVAL  1991   stomach tumor removal  1985   TOTAL ABDOMINAL HYSTERECTOMY W/ BILATERAL SALPINGOOPHORECTOMY  2006   chronic pelvic pain  No FMH of colon cancer.   No Etoh use  Family History  Problem Relation Age of Onset   Cancer Maternal Grandmother    Hypertension Father    Diabetes Mother    Renal Disease Mother     Social History   Socioeconomic History   Marital status: Divorced    Spouse name: Not on file   Number of children: Not on file   Years of education: Not on file   Highest education level: Not on file  Occupational History   Not on file  Tobacco Use   Smoking status: Never   Smokeless tobacco: Not on file  Substance and Sexual Activity   Alcohol  use: Not on file   Drug use: Not on file   Sexual activity: Not on file  Other Topics Concern   Not on file  Social History Narrative   Not on file   Social Drivers of Health   Tobacco Use: Unknown (11/12/2024)   Patient History    Smoking Tobacco Use: Never    Smokeless Tobacco Use: Unknown    Passive Exposure: Not on file  Financial Resource Strain: Low Risk  (09/05/2024)   Received from [facility]   Overall Financial Resource Strain (CARDIA)    Difficulty of Paying Living Expenses: Not hard at all  Food Insecurity: No Food Insecurity (11/13/2024)   Epic    Worried About Radiation Protection Practitioner of Food in the Last Year: Never true    Ran Out of Food in the Last Year: Never true  Transportation Needs: No Transportation Needs (11/13/2024)   Epic    Lack of Transportation (Medical): No    Lack of Transportation (Non-Medical): No  Physical Activity: Not on file  Stress: Not on file  Social Connections: Not on file  Intimate Partner Violence: Not At Risk (11/13/2024)   Epic    Fear of Current or Ex-Partner: No    Emotionally Abused: No    Physically Abused: No    Sexually Abused: No  Depression (PHQ2-9): Low Risk (10/12/2024)   Depression (PHQ2-9)    PHQ-2 Score: 3  Alcohol Screen: Not on file  Housing: Low Risk (11/13/2024)   Epic    Unable to Pay for Housing in the Last Year: No    Number of Times Moved in the Last Year: 0    Homeless in the Last Year: No  Utilities: Not At Risk (11/13/2024)   Epic    Threatened with loss of utilities: No  Health Literacy: Not on file    Code Status   Code Status: Full Code   Vina Dasen, NP-C   11/13/2024, 10:48 AM  -------------------------------------------------------  I have taken a history, reviewed the chart and examined the patient. I performed a substantive portion of this encounter, including complete performance of at least one of the key components, in conjunction with the APP. I agree with the APP's note, impression  and recommendations  59 year old female with history of idiopathic cardiomyopathy, severe MR, atrial stenosis status post mechanical mitral and aortic valve replacement admitted with severe symptomatic anemia with hemoglobin 4.4 down from baseline of around 10.  She had been having some recent upper GI symptoms of nausea and epigastric pain and had reported dark stools recently. Iron panel consistent with severe iron deficiency anemia.  Fecal occult blood test positive.  Hemoglobin responded well to 3 units PRBCs. No previous upper endoscopy.  Patient has had remote colonoscopy, but it has been many years.  Patient  is very likely having a low-grade GI bleed.  Suspect intestinal AVMs given her valvular disease.  Although plan was initially to perform upper endoscopy given the likelihood of an upper GI source, given patient's mechanical valve that requires close management of anticoagulation, it would be best to perform her colonoscopy while admitted and the patient can be maintained on a heparin  drip while off Coumadin.  Will therefore plan to perform EGD and colonoscopy this admission.  Would prefer INR to be closer to 2.5.  Recommend continue to monitor INR and hold Coumadin.  Once INR 2.5 recommend initiation of heparin  drip.  Will likely be able to do these procedures on Wednesday.  Continue clear liquid diet and to hold Coumadin for now.  Quashaun Lazalde E. Stacia, MD [facility]  High complex medical decision making based on high procedural risk and complicated cardiac history (this includes chart review, review of results, face-to-face time used for counseling as well as treatment plan and follow-up. The patient was provided an opportunity to ask questions and all were answered. The patient agreed with the plan and demonstrated an understanding of the instructions    "

## 2024-11-13 NOTE — Progress Notes (Signed)
 ANTICOAGULATION CONSULT NOTE  Pharmacy Consult for Heparin  Indication: mechanical valve  Allergies[1]  Patient Measurements: Height: 5' 4 (162.6 cm) Weight: 65.8 kg (145 lb) IBW/kg (Calculated) : 54.7 Heparin  dosing weight: 65.8 kg  Vital Signs: Temp: 98.3 F (36.8 C) (01/05 0600) Temp Source: Oral (01/05 0600) BP: 111/56 (01/05 0700) Pulse Rate: 101 (01/05 0700)  Labs: Recent Labs    11/12/24 0945 11/12/24 1143 11/12/24 1642 11/12/24 2118 11/13/24 0431 11/13/24 0650  HGB 4.4*   < >  --  7.3* 7.2* 7.0*  HCT 15.2*   < >  --  22.8* 22.1* 21.7*  PLT 250   < >  --  236 234 223  LABPROT  --   --  31.7*  --  32.7*  --   INR  --   --  2.9*  --  3.0*  --   CREATININE 1.18*  --   --   --  1.14*  --    < > = values in this interval not displayed.    Estimated Creatinine Clearance: 50.2 mL/min (A) (by C-G formula based on SCr of 1.14 mg/dL (H)).   Medical History: Past Medical History:  Diagnosis Date   Anemia    Clotting disorder    Endometriosis    Fibroids    GERD (gastroesophageal reflux disease)    Murmur, cardiac     Medications:  (Not in a hospital admission)  Scheduled:   sodium chloride    Intravenous Once   atorvastatin   20 mg Oral Daily   metoprolol  tartrate  12.5 mg Oral BID   pantoprazole  (PROTONIX ) IV  40 mg Intravenous Q12H   sodium chloride  flush  3 mL Intravenous Q12H   Infusions:  PRN: acetaminophen  **OR** acetaminophen , zolpidem   Assessment: 23 yof with a history of HTN, GERD, s/p AVR on warfarin. Patient is presenting with symptomatic anemia. Patient taking warfarin prior to arrival. Home dose is 5mg  daily. Last taken 11/11/24. Heparin  per pharmacy consult placed for mechanical valve.  INR 3.0, therapeutic  CBC stable this morning s/p pRBC. No overt s/sx bleeding.  Goal of Therapy:  INR Goal 2.5-3.5 HL 0.3-0.7 Monitor platelets by anticoagulation protocol: Yes  Plan:  Begin heparin  gtt once INR < 2.5 F/u anemia work-up and plan to  restart warfarin  Thank you for allowing pharmacy to participate in this patient's care.  Leonor GORMAN Bash, PharmD Emergency Medicine Clinical Pharmacist 11/13/2024,7:30 AM          [1]  Allergies Allergen Reactions   Ace Inhibitors Cough and Shortness Of Breath   Vancomycin Anaphylaxis and Other (See Comments)    Other Reaction: severe hypotension   Iodine    Latex

## 2024-11-13 NOTE — Progress Notes (Signed)
 Echocardiogram 2D Echocardiogram has been performed.  Natasha Mcknight 11/13/2024, 3:15 PM

## 2024-11-13 NOTE — ED Notes (Signed)
 Pt connected back to monitor. Mose position in front of pt to eat. Ginger ale provided. Call bell within reach.

## 2024-11-13 NOTE — Progress Notes (Signed)
 " PROGRESS NOTE  Natasha Mcknight FMW:[id number] DOB: Feb 22, 1966 DOA: 11/12/2024 PCP: Vernon Velna SAUNDERS, MD   LOS: 1 day   Brief Narrative / Interim history: 59 year old female with HTN, GERD, status post mechanical and aortic valve replacement in September 2025 at [facility], comes into the hospital with weakness, dizziness.  This has been going on roughly acutely over the last 3 to 4 days.  She has been nauseous during that time.  She denies any chest pains.  She denies any frank bleeding in her stool but did notice that her stools are darker than normal.  In the ED she was found to have a hemoglobin of 4.4, fecal occult was positive.  She was admitted to the hospital, and GI consulted.  Of note, EDP reached out to [facility] cardiothoracic surgery who said that patient could be admitted here and get it 2D echo followed by TTE, and if any issues with the valve to reach back for potential transfer.  Subjective / 24h Interval events: Patient is feeling well this morning, received blood transfusions overnight.  She currently denies any significant chest discomfort, shortness of breath.  She has no nausea or vomiting.  Assesement and Plan: Principal problem Symptomatic anemia, positive FOBT-patient presented to the hospital with a hemoglobin of 4.4.  She certainly has a degree of hemolysis given her mechanical valves, LDH is elevated, however haptoglobin, total bilirubin are normal, and also there is no hemoglobinuria, suggesting against significant hemolysis. - Fecal occult was positive, which may be the culprit.  Gastroenterology consulted, appreciate input. - She was transfused 2 unit of packed red blood cells, hemoglobin 7.0 this morning, will transfuse a 3rd unit - For now hold anticoagulation, high risk  Active problems History of mitral and aortic mechanical valves -replaced at [facility] in September 2024.  She was placed on warfarin since then.  She was not on anticoagulation prior to that. - INR  is 3.0 which is still within therapeutic range.  Will closely monitor and consider heparin  infusion when INR drops below 2.5  Essential hypertension-continue home metoprolol   CKD 3A -baseline creatinine appears to be around 1.5-1.5 at [facility], currently at baseline  Type 2 diabetes mellitus-most recent A1c at [facility] 6.8.  Continue to monitor  Chronic diastolic CHF -most recent echo showed grade 3 diastolic dysfunction.  She is on furosemide at home.  Currently appears euvolemic, closely watch volume status and resume furosemide as indicated  Hyperlipidemia-continue statin  Scheduled Meds:  sodium chloride    Intravenous Once   atorvastatin   20 mg Oral Daily   metoprolol  tartrate  12.5 mg Oral BID   pantoprazole  (PROTONIX ) IV  40 mg Intravenous Q12H   sodium chloride  flush  3 mL Intravenous Q12H   Continuous Infusions: PRN Meds:.acetaminophen  **OR** acetaminophen , zolpidem   Current Outpatient Medications  Medication Instructions   acetaminophen  (TYLENOL ) 500 mg, Every 6 hours PRN   aspirin 81 mg, Daily   atorvastatin  (LIPITOR) 20 mg, Daily   furosemide (LASIX) 20 mg, Oral, As needed   Jardiance 10 mg, Daily   metoprolol  tartrate (LOPRESSOR ) 25 mg, 2 times daily   omeprazole (PRILOSEC) 20 mg, Daily   oxyCODONE (OXY IR/ROXICODONE) 5 mg, Every 6 hours PRN   Ozempic (0.25 or 0.5 MG/DOSE) 0.5 mg, Weekly   senna-docusate (SENOKOT-S) 8.6-50 MG tablet 2 tablets, Daily PRN   warfarin (COUMADIN) 5 mg, Daily   zolpidem  (AMBIEN ) 10 mg, At bedtime PRN    Diet Orders (From admission, onward)     Start  Ordered   11/12/24 1600  Diet clear liquid Room service appropriate? Yes; Fluid consistency: Thin; Fluid restriction: 2000 mL Fluid  Diet effective now       Question Answer Comment  Room service appropriate? Yes   Fluid consistency: Thin   Fluid restriction: 2000 mL Fluid      11/12/24 1516            DVT prophylaxis:    Lab Results  Component Value Date   PLT 223 11/13/2024       Code Status: Full Code  Family Communication: No family was present at bedside  Status is: Inpatient Remains inpatient appropriate because: Severity of illness disorder   Level of care: Progressive  Consultants:  Gastroenterology Cardiology  Objective: Vitals:   11/13/24 0820 11/13/24 0825 11/13/24 0830 11/13/24 1035  BP: (!) 112/57 111/64 114/75 115/63  Pulse: 99 98 99 99  Resp: 20 19 18 20   Temp: 97.8 F (36.6 C) 97.7 F (36.5 C) 97.8 F (36.6 C) 98 F (36.7 C)  TempSrc: Oral Oral Oral Oral  SpO2: 100% 100% 100% 100%  Weight:      Height:        Intake/Output Summary (Last 24 hours) at 11/13/2024 1112 Last data filed at 11/13/2024 1034 Gross per 24 hour  Intake 764.67 ml  Output --  Net 764.67 ml   Wt Readings from Last 3 Encounters:  11/12/24 65.8 kg  10/12/24 68.3 kg  09/23/12 70.3 kg    Examination:  Constitutional: NAD Eyes: no scleral icterus ENMT: Mucous membranes are moist.  Neck: normal, supple Respiratory: clear to auscultation bilaterally, no wheezing, no crackles. Normal respiratory effort.  Cardiovascular: Regular rate and rhythm, mechanical click present. No LE edema.  Abdomen: non distended, no tenderness. Bowel sounds positive.  Musculoskeletal: no clubbing / cyanosis.   Data Reviewed: I have independently reviewed following labs and imaging studies   CBC Recent Labs  Lab 11/12/24 0945 11/12/24 1143 11/12/24 2118 11/13/24 0431 11/13/24 0650  WBC 7.4 9.5 9.2 8.9 9.0  HGB 4.4* 4.6* 7.3* 7.2* 7.0*  HCT 15.2* 16.3* 22.8* 22.1* 21.7*  PLT 250 264 236 234 223  MCV 83.5 86.2 85.7 83.7 84.4  MCH 24.2* 24.3* 27.4 27.3 27.2  MCHC 28.9* 28.2* 32.0 32.6 32.3  RDW 17.2* 17.3* 15.9* 16.1* 16.3*  LYMPHSABS 1.1  --   --   --  1.9  MONOABS 0.4  --   --   --  0.8  EOSABS 0.0  --   --   --  0.1  BASOSABS 0.0  --   --   --  0.0    Recent Labs  Lab 11/12/24 0945 11/12/24 1642 11/13/24 0431  NA 141  --  139  K 3.5  --  3.9  CL 104   --  105  CO2 24  --  25  GLUCOSE 195*  --  107*  BUN 26*  --  23*  CREATININE 1.18*  --  1.14*  CALCIUM  9.8  --  8.6*  AST 23  --  20  ALT 21  --  17  ALKPHOS 96  --  68  BILITOT 0.4  --  0.7  ALBUMIN 4.1  --  3.8  CRP <0.5  --   --   INR  --  2.9* 3.0*    ------------------------------------------------------------------------------------------------------------------ No results for input(s): CHOL, HDL, LDLCALC, TRIG, CHOLHDL, LDLDIRECT in the last 72 hours.  No results found for: HGBA1C ------------------------------------------------------------------------------------------------------------------ No results for  input(s): TSH, T4TOTAL, T3FREE, THYROIDAB in the last 72 hours.  Invalid input(s): FREET3  Cardiac Enzymes No results for input(s): CKMB, TROPONINI, MYOGLOBIN in the last 168 hours.  Invalid input(s): CK ------------------------------------------------------------------------------------------------------------------ No results found for: BNP  CBG: No results for input(s): GLUCAP in the last 168 hours.  No results found for this or any previous visit (from the past 240 hours).   Radiology Studies: No results found.   Nilda Fendt, MD, PhD [facility]  Between 7 am - 7 pm I am available, please contact me via Amion (for emergencies) or Securechat (non urgent messages)  Between 7 pm - 7 am I am not available, please contact night coverage MD/APP via Amion  "

## 2024-11-14 DIAGNOSIS — K922 Gastrointestinal hemorrhage, unspecified: Secondary | ICD-10-CM | POA: Diagnosis not present

## 2024-11-14 DIAGNOSIS — Z952 Presence of prosthetic heart valve: Secondary | ICD-10-CM | POA: Diagnosis not present

## 2024-11-14 DIAGNOSIS — I080 Rheumatic disorders of both mitral and aortic valves: Secondary | ICD-10-CM | POA: Diagnosis not present

## 2024-11-14 DIAGNOSIS — D509 Iron deficiency anemia, unspecified: Secondary | ICD-10-CM | POA: Diagnosis not present

## 2024-11-14 DIAGNOSIS — D649 Anemia, unspecified: Secondary | ICD-10-CM | POA: Diagnosis not present

## 2024-11-14 LAB — PROTIME-INR
INR: 3 — ABNORMAL HIGH (ref 0.8–1.2)
Prothrombin Time: 32.2 s — ABNORMAL HIGH (ref 11.4–15.2)

## 2024-11-14 MED ORDER — BUTALBITAL-APAP-CAFFEINE 50-325-40 MG PO TABS
1.0000 | ORAL_TABLET | Freq: Four times a day (QID) | ORAL | Status: DC | PRN
  Administered 2024-11-14 – 2024-11-15 (×2): 1 via ORAL
  Filled 2024-11-14 (×4): qty 1

## 2024-11-14 MED ORDER — ONDANSETRON HCL 4 MG/2ML IJ SOLN
INTRAMUSCULAR | Status: AC
  Filled 2024-11-14: qty 2

## 2024-11-14 MED ORDER — METOPROLOL TARTRATE 5 MG/5ML IV SOLN
2.5000 mg | INTRAVENOUS | Status: DC | PRN
  Administered 2024-11-15: 2.5 mg via INTRAVENOUS
  Filled 2024-11-14: qty 5

## 2024-11-14 MED ORDER — ONDANSETRON HCL 4 MG/2ML IJ SOLN
4.0000 mg | Freq: Four times a day (QID) | INTRAMUSCULAR | Status: DC | PRN
  Administered 2024-11-14: 4 mg via INTRAVENOUS

## 2024-11-14 NOTE — Progress Notes (Signed)
 " PROGRESS NOTE  Natasha Mcknight FMW:[id number] DOB: [birth year] DOA: 11/12/2024 PCP: Vernon Velna SAUNDERS, MD   LOS: 2 days   Brief Narrative / Interim history: 59 year old female with HTN, GERD, status post mechanical and aortic valve replacement in September 2025 at [facility], comes into the hospital with weakness, dizziness.  This has been going on roughly acutely over the last 3 to 4 days.  She has been nauseous during that time.  She denies any chest pains.  She denies any frank bleeding in her stool but did notice that her stools are darker than normal.  In the ED she was found to have a hemoglobin of 4.4, fecal occult was positive.  She was admitted to the hospital, and GI consulted.  Of note, EDP reached out to [facility] cardiothoracic surgery who said that patient could be admitted here and get it 2D echo followed by TTE, and if any issues with the valve to reach back for potential transfer.  Subjective / 24h Interval events: She is doing well today, she feels much better after blood transfusions.  She was up and ambulating to the bathroom and back without further shortness of breath, dizziness.  Assesement and Plan: Principal problem Symptomatic anemia, positive FOBT-patient presented to the hospital with a hemoglobin of 4.4.  She certainly has a degree of subclinical hemolysis given her mechanical valves, LDH is elevated, however haptoglobin, total bilirubin are normal, and also there is no hemoglobinuria, suggesting against significant hemolysis. - Fecal occult was positive, which may be the culprit.  Gastroenterology consulted, appreciate input.  Plans are in place for patient to get endoscopic evaluation tomorrow - She was transfused a total of 3 units of packed red blood cells, hemoglobin improved appropriately. - Continue to monitor, INR 3.0 today  Active problems History of mitral and aortic mechanical valves -replaced at [facility] in September 2024.  She was placed on warfarin since  then.  She was not on anticoagulation prior to that. - INR is 3.0 which is still within therapeutic range.  Will closely monitor and consider heparin  infusion when INR drops below 2.5 - Underwent a 2D echocardiogram yesterday which did not show any valvular issues, they are well-seated and function accordingly.  Discussed with cardiology team today, no need for TEE given lack of clinically significant hemolysis  Essential hypertension-continue home metoprolol , blood pressure is stable  CKD 3A -baseline creatinine appears to be around 1.5-1.5 at [facility], she is currently at baseline  Type 2 diabetes mellitus-most recent A1c at [facility] 6.8.  Continue to monitor  Chronic diastolic CHF -most recent echo showed grade 3 diastolic dysfunction.  She is on furosemide at home.  Currently appears euvolemic, closely watch volume status and resume furosemide as indicated  Hyperlipidemia-continue statin  Scheduled Meds:  sodium chloride    Intravenous Once   atorvastatin   20 mg Oral Daily   metoprolol  tartrate  12.5 mg Oral BID   ondansetron        pantoprazole  (PROTONIX ) IV  40 mg Intravenous Q12H   sodium chloride  flush  3 mL Intravenous Q12H   Continuous Infusions: PRN Meds:.acetaminophen  **OR** acetaminophen , butalbital -acetaminophen -caffeine , ondansetron , ondansetron  (ZOFRAN ) IV, mouth rinse, zolpidem   Current Outpatient Medications  Medication Instructions   acetaminophen  (TYLENOL ) 500 mg, Every 6 hours PRN   aspirin 81 mg, Daily   atorvastatin  (LIPITOR) 20 mg, Daily   furosemide (LASIX) 20 mg, Oral, As needed   Jardiance 10 mg, Daily   metoprolol  tartrate (LOPRESSOR ) 25 mg, 2 times daily   omeprazole (PRILOSEC)  20 mg, Daily   oxyCODONE (OXY IR/ROXICODONE) 5 mg, Every 6 hours PRN   Ozempic (0.25 or 0.5 MG/DOSE) 0.5 mg, Weekly   senna-docusate (SENOKOT-S) 8.6-50 MG tablet 2 tablets, Daily PRN   warfarin (COUMADIN) 5 mg, Daily   zolpidem  (AMBIEN ) 10 mg, At bedtime PRN    Diet Orders (From  admission, onward)     Start     Ordered   11/14/24 0808  Diet Heart Room service appropriate? Yes; Fluid consistency: Thin  Diet effective now       Question Answer Comment  Room service appropriate? Yes   Fluid consistency: Thin      11/14/24 0807            DVT prophylaxis:    Lab Results  Component Value Date   PLT 223 11/13/2024      Code Status: Full Code  Family Communication: No family was present at bedside  Status is: Inpatient Remains inpatient appropriate because: Severity of illness disorder   Level of care: Progressive  Consultants:  Gastroenterology Cardiology  Objective: Vitals:   11/14/24 0359 11/14/24 0744 11/14/24 0906 11/14/24 1114  BP: 111/61 120/62 127/69 106/67  Pulse: (!) 101 99 (!) 101 (!) 110  Resp: 17 18  20   Temp: 98 F (36.7 C) 98.7 F (37.1 C)  99.3 F (37.4 C)  TempSrc: Oral Oral  Oral  SpO2: 100% (!) 88% 98% 98%  Weight:      Height:        Intake/Output Summary (Last 24 hours) at 11/14/2024 1122 Last data filed at 11/14/2024 0900 Gross per 24 hour  Intake 120 ml  Output --  Net 120 ml   Wt Readings from Last 3 Encounters:  11/12/24 65.8 kg  10/12/24 68.3 kg  09/23/12 70.3 kg    Examination:  Constitutional: NAD Eyes: lids and conjunctivae normal, no scleral icterus ENMT: mmm Neck: normal, supple Respiratory: clear to auscultation bilaterally, no wheezing, no crackles. Normal respiratory effort.  Cardiovascular: Regular rate and rhythm, mechanical clicks present. No LE edema. Abdomen: soft, no distention, no tenderness. Bowel sounds positive.   Data Reviewed: I have independently reviewed following labs and imaging studies   CBC Recent Labs  Lab 11/12/24 0945 11/12/24 1143 11/12/24 2118 11/13/24 0431 11/13/24 0650 11/13/24 1308  WBC 7.4 9.5 9.2 8.9 9.0 8.5  HGB 4.4* 4.6* 7.3* 7.2* 7.0* 8.8*  HCT 15.2* 16.3* 22.8* 22.1* 21.7* 27.1*  PLT 250 264 236 234 223 223  MCV 83.5 86.2 85.7 83.7 84.4 86.6   MCH 24.2* 24.3* 27.4 27.3 27.2 28.1  MCHC 28.9* 28.2* 32.0 32.6 32.3 32.5  RDW 17.2* 17.3* 15.9* 16.1* 16.3* 16.0*  LYMPHSABS 1.1  --   --   --  1.9  --   MONOABS 0.4  --   --   --  0.8  --   EOSABS 0.0  --   --   --  0.1  --   BASOSABS 0.0  --   --   --  0.0  --     Recent Labs  Lab 11/12/24 0945 11/12/24 1642 11/13/24 0431 11/14/24 0216  NA 141  --  139  --   K 3.5  --  3.9  --   CL 104  --  105  --   CO2 24  --  25  --   GLUCOSE 195*  --  107*  --   BUN 26*  --  23*  --   CREATININE 1.18*  --  1.14*  --   CALCIUM  9.8  --  8.6*  --   AST 23  --  20  --   ALT 21  --  17  --   ALKPHOS 96  --  68  --   BILITOT 0.4  --  0.7  --   ALBUMIN 4.1  --  3.8  --   CRP <0.5  --   --   --   INR  --  2.9* 3.0* 3.0*    ------------------------------------------------------------------------------------------------------------------ No results for input(s): CHOL, HDL, LDLCALC, TRIG, CHOLHDL, LDLDIRECT in the last 72 hours.  No results found for: HGBA1C ------------------------------------------------------------------------------------------------------------------ No results for input(s): TSH, T4TOTAL, T3FREE, THYROIDAB in the last 72 hours.  Invalid input(s): FREET3  Cardiac Enzymes No results for input(s): CKMB, TROPONINI, MYOGLOBIN in the last 168 hours.  Invalid input(s): CK ------------------------------------------------------------------------------------------------------------------ No results found for: BNP  CBG: No results for input(s): GLUCAP in the last 168 hours.  No results found for this or any previous visit (from the past 240 hours).   Radiology Studies: ECHOCARDIOGRAM COMPLETE BUBBLE STUDY Result Date: 11/13/2024    ECHOCARDIOGRAM REPORT   Patient Name:   Natasha Mcknight Date of Exam: 11/13/2024 Medical Rec #:  [id number]          Height:       64.0 in Accession #:    [id number]         Weight:       145.0 lb Date of  Birth:  [birth year]         BSA:          1.706 m Patient Age:    58 years           BP:           121/62 mmHg Patient Gender: F                  HR:           101 bpm. Exam Location:  Inpatient Procedure: 2D Echo, Cardiac Doppler and Color Doppler (Both Spectral and Color            Flow Doppler were utilized during procedure). Indications:    Anemia  History:        Patient has no prior history of Echocardiogram examinations.                 Risk Factors:Hypertension.                 Aortic Valve: 19 mm Regent mechanical valve is present in the                 aortic position. Procedure Date: 9/25.                 Mitral Valve: 23 mm Regent mechanical valve valve is present in                 the mitral position.  Sonographer:    Merlynn Argyle Referring Phys: [id number] JERILYNN GORMAN LATER  Sonographer Comments: Image acquisition challenging due to patient body habitus. IMPRESSIONS  1. Left ventricular ejection fraction, by estimation, is 55 to 60%. The left ventricle has normal function. The left ventricle has no regional wall motion abnormalities. Left ventricular diastolic parameters are indeterminate.  2. Right ventricular systolic function is normal. The right ventricular size is mildly enlarged. There is normal pulmonary artery systolic pressure.  3. Left atrial size was moderately dilated.  4. The  mitral valve has been repaired/replaced. No evidence of mitral valve regurgitation. No evidence of mitral stenosis. There is a 23 mm Regent mechanical valve present in the mitral position. Echo findings are consistent with normal structure and function of the mitral valve prosthesis.  5. Tricuspid valve regurgitation is moderate.  6. The aortic valve has been repaired/replaced. Aortic valve regurgitation is not visualized. No aortic stenosis is present. There is a 19 mm Regent mechanical valve present in the aortic position. Procedure Date: 9/25. Echo findings are consistent with  normal structure and function of the  aortic valve prosthesis.  7. The inferior vena cava is normal in size with greater than 50% respiratory variability, suggesting right atrial pressure of 3 mmHg.  8. Agitated saline contrast bubble study was negative, with no evidence of any interatrial shunt. Conclusion(s)/Recommendation(s): No intracardiac source of embolism detected on this transthoracic study. Consider a transesophageal echocardiogram to exclude cardiac source of embolism if clinically indicated. FINDINGS  Left Ventricle: Left ventricular ejection fraction, by estimation, is 55 to 60%. The left ventricle has normal function. The left ventricle has no regional wall motion abnormalities. The left ventricular internal cavity size was normal in size. There is  no left ventricular hypertrophy. Left ventricular diastolic parameters are indeterminate. Right Ventricle: The right ventricular size is mildly enlarged. No increase in right ventricular wall thickness. Right ventricular systolic function is normal. There is normal pulmonary artery systolic pressure. The tricuspid regurgitant velocity is 2.84  m/s, and with an assumed right atrial pressure of 3 mmHg, the estimated right ventricular systolic pressure is 35.3 mmHg. Left Atrium: Left atrial size was moderately dilated. Right Atrium: Right atrial size was normal in size. Pericardium: There is no evidence of pericardial effusion. Mitral Valve: The mitral valve has been repaired/replaced. No evidence of mitral valve regurgitation. There is a 23 mm Regent mechanical valve present in the mitral position. Echo findings are consistent with normal structure and function of the mitral valve prosthesis. No evidence of mitral valve stenosis. MV peak gradient, 19.7 mmHg. The mean mitral valve gradient is 11.0 mmHg. Tricuspid Valve: The tricuspid valve is normal in structure. Tricuspid valve regurgitation is moderate . No evidence of tricuspid stenosis. Aortic Valve: The aortic valve has been  repaired/replaced. Aortic valve regurgitation is not visualized. No aortic stenosis is present. Aortic valve mean gradient measures 12.0 mmHg. Aortic valve peak gradient measures 21.7 mmHg. Aortic valve area, by VTI measures 0.65 cm. There is a 19 mm Regent mechanical valve present in the aortic position. Procedure Date: 9/25. Echo findings are consistent with normal structure and function of the aortic valve prosthesis. Pulmonic Valve: The pulmonic valve was normal in structure. Pulmonic valve regurgitation is not visualized. No evidence of pulmonic stenosis. Aorta: The aortic root is normal in size and structure. Venous: The inferior vena cava is normal in size with greater than 50% respiratory variability, suggesting right atrial pressure of 3 mmHg. IAS/Shunts: No atrial level shunt detected by color flow Doppler. Agitated saline contrast was given intravenously to evaluate for intracardiac shunting. Agitated saline contrast bubble study was negative, with no evidence of any interatrial shunt.  LEFT VENTRICLE PLAX 2D LVIDd:         4.60 cm LVIDs:         2.80 cm LV PW:         0.80 cm LV IVS:        0.80 cm LVOT diam:     1.50 cm LV SV:  28 LV SV Index:   16 LVOT Area:     1.77 cm  RIGHT VENTRICLE             IVC RV Basal diam:  4.30 cm     IVC diam: 1.70 cm RV Mid diam:    3.60 cm RV S prime:     10.40 cm/s TAPSE (M-mode): 1.1 cm LEFT ATRIUM             Index        RIGHT ATRIUM           Index LA diam:        4.30 cm 2.52 cm/m   RA Area:     24.60 cm LA Vol (A2C):   77.8 ml 45.59 ml/m  RA Volume:   88.60 ml  51.92 ml/m LA Vol (A4C):   73.5 ml 43.07 ml/m LA Biplane Vol: 78.6 ml 46.06 ml/m  AORTIC VALVE AV Area (Vmax):    0.65 cm AV Area (Vmean):   0.66 cm AV Area (VTI):     0.65 cm AV Vmax:           233.00 cm/s AV Vmean:          166.000 cm/s AV VTI:            0.430 m AV Peak Grad:      21.7 mmHg AV Mean Grad:      12.0 mmHg LVOT Vmax:         85.20 cm/s LVOT Vmean:        61.900 cm/s LVOT  VTI:          0.159 m LVOT/AV VTI ratio: 0.37  AORTA Ao Root diam: 3.00 cm Ao Asc diam:  3.40 cm MITRAL VALVE              TRICUSPID VALVE MV Area VTI:  0.68 cm    TR Peak grad:   32.3 mmHg MV Peak grad: 19.7 mmHg   TR Vmax:        284.00 cm/s MV Mean grad: 11.0 mmHg MV Vmax:      2.22 m/s    SHUNTS MV Vmean:     154.0 cm/s  Systemic VTI:  0.16 m                           Systemic Diam: 1.50 cm Oneil Parchment MD Electronically signed by Oneil Parchment MD Signature Date/Time: 11/13/2024/4:38:42 PM    Final      Nilda Fendt, MD, PhD [facility]  Between 7 am - 7 pm I am available, please contact me via Amion (for emergencies) or Securechat (non urgent messages)  Between 7 pm - 7 am I am not available, please contact night coverage MD/APP via Amion  "

## 2024-11-14 NOTE — TOC Initial Note (Signed)
 Transition of Care [facility]) - Initial/Assessment Note    Patient Details  Name: Natasha Mcknight MRN: [id number] Date of Birth: Jul 03, 1966  [facility]) CM/SW Contact:    Natasha JULIANNA George, RN Phone Number: 11/14/2024, 10:33 AM  Clinical Narrative:                  Natasha Mcknight is a 59 y.o. female with medical history significant of Hypertension, GERD, status post aortic valve replacement, status post mitral valve replacement, anemia presenting with dizziness, nausea, vomiting.   Pt is from home with her son. Son works in the evenings.  She drives but son can assist if needed.  Pt manages her own medications without any issues.   Awaiting EGD/ Colonoscopy once INR in correct range.  IP Care management following.  Expected Discharge Plan: Home/Self Care Barriers to Discharge: Continued Medical Work up   Patient Goals and CMS Choice            Expected Discharge Plan and Services       Living arrangements for the past 2 months: Single Family Home                                      Prior Living Arrangements/Services Living arrangements for the past 2 months: Single Family Home Lives with:: Adult Children Patient language and need for interpreter reviewed:: Yes Do you feel safe going back to the place where you live?: Yes          Current home services: DME (shower seat/ rollator) Criminal Activity/Legal Involvement Pertinent to Current Situation/Hospitalization: No - Comment as needed  Activities of Daily Living   ADL Screening (condition at time of admission) Independently performs ADLs?: Yes (appropriate for developmental age) Is the patient deaf or have difficulty hearing?: No Does the patient have difficulty seeing, even when wearing glasses/contacts?: No Does the patient have difficulty concentrating, remembering, or making decisions?: No  Permission Sought/Granted                  Emotional Assessment Appearance:: Appears  stated age Attitude/Demeanor/Rapport: Engaged Affect (typically observed): Accepting Orientation: : Oriented to Self, Oriented to Place, Oriented to  Time, Oriented to Situation   Psych Involvement: No (comment)  Admission diagnosis:  Symptomatic anemia [D64.9] Anemia, unspecified type [D64.9] Patient Active Problem List   Diagnosis Date Noted   Mitral valve disease 11/13/2024   Chronic anticoagulation 11/13/2024   Gastrointestinal hemorrhage 11/13/2024   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   S/P mitral valve repair 11/12/2024   Acquired hemolytic anemia (HCC) 11/12/2024   Symptomatic anemia 11/12/2024   H/O mitral valve replacement with mechanical valve 08/08/2024   S/P AVR (aortic valve replacement) 08/08/2024   Essential hypertension with goal blood pressure less than 140/90 02/20/2015   NAUSEA 01/22/2009   ABDOMINAL PAIN-MULTIPLE SITES 01/22/2009   ALANINE AMINOTRANSFERASE, SERUM, ELEVATED 01/22/2009   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   UNSPECIFIED ANEMIA 01/21/2009   GERD 01/21/2009   Constipation 01/21/2009   CARDIAC MURMUR 01/21/2009   HEARTBURN 01/21/2009   OVARIAN MASS 11/09/1992   PCP:  Natasha Velna SAUNDERS, MD Pharmacy:   [id number] [place], [place] - [address] AT [facility] & [address] [place] [address] Phone: [phone] Fax: [phone]     Social Drivers of Health (SDOH) Social History: SDOH Screenings  Food Insecurity: No Food Insecurity (11/13/2024)  Housing: Low Risk (11/13/2024)  Transportation Needs: No Transportation Needs (11/13/2024)  Utilities: Not At Risk (11/13/2024)  Depression (PHQ2-9): Low Risk (10/12/2024)  Financial Resource Strain: Low Risk  (09/05/2024)   Received from [facility]  Tobacco Use: Unknown (11/12/2024)   SDOH Interventions:     Readmission Risk Interventions     No data to display

## 2024-11-14 NOTE — Progress Notes (Signed)
 "  Rounding Note   Patient Name: Natasha Mcknight Date of Encounter: 11/14/2024  [facility] Cardiologist: CONWAY, BENJAMIN JOHN, MD   Subjective  No complaints today. She feels great.   Scheduled Meds:  sodium chloride    Intravenous Once   atorvastatin   20 mg Oral Daily   metoprolol  tartrate  12.5 mg Oral BID   ondansetron        pantoprazole  (PROTONIX ) IV  40 mg Intravenous Q12H   sodium chloride  flush  3 mL Intravenous Q12H   Continuous Infusions:  PRN Meds: acetaminophen  **OR** acetaminophen , butalbital -acetaminophen -caffeine , ondansetron , ondansetron  (ZOFRAN ) IV, mouth rinse, zolpidem    Vital Signs  Vitals:   11/13/24 2023 11/14/24 0006 11/14/24 0359 11/14/24 0744  BP: 116/63 127/71 111/61 120/62  Pulse: (!) 104 100 (!) 101 99  Resp: 18 17 17 18   Temp: 98.1 F (36.7 C) 98.8 F (37.1 C) 98 F (36.7 C) 98.7 F (37.1 C)  TempSrc: Oral Oral Oral Oral  SpO2: 100% 100% 100% (!) 88%  Weight:      Height:        Intake/Output Summary (Last 24 hours) at 11/14/2024 0856 Last data filed at 11/13/2024 1034 Gross per 24 hour  Intake 394.67 ml  Output --  Net 394.67 ml      11/12/2024    9:33 AM 10/12/2024    4:49 PM 09/23/2012    1:48 PM  Last 3 Weights  Weight (lbs) 145 lb 150 lb 9.2 oz 155 lb  Weight (kg) 65.772 kg 68.3 kg 70.308 kg      Telemetry  Sinus tach, rate 102 bpm- Personally Reviewed  ECG  No am tracing  Physical Exam  GEN: No acute distress.   Neck: No JVD Cardiac: RRR, no murmurs, rubs, or gallops.  Respiratory: Clear to auscultation bilaterally. GI: Soft, nontender, non-distended  MS: No edema; No deformity. Neuro:  Nonfocal  Psych: Normal affect   Labs High Sensitivity Troponin:  No results for input(s): TROPONINIHS in the last 720 hours.  Recent Labs  Lab 11/12/24 1143  TRNPT 27*       Chemistry Recent Labs  Lab 11/12/24 0945 11/13/24 0431  NA 141 139  K 3.5 3.9  CL 104 105  CO2 24 25  GLUCOSE 195* 107*  BUN  26* 23*  CREATININE 1.18* 1.14*  CALCIUM  9.8 8.6*  PROT 6.6 5.9*  ALBUMIN 4.1 3.8  AST 23 20  ALT 21 17  ALKPHOS 96 68  BILITOT 0.4 0.7  GFRNONAA 53* 56*  ANIONGAP 13 9    Lipids No results for input(s): CHOL, TRIG, HDL, LABVLDL, LDLCALC, CHOLHDL in the last 168 hours.  Hematology Recent Labs  Lab 11/13/24 0431 11/13/24 0650 11/13/24 1308  WBC 8.9 9.0 8.5  RBC 2.64* 2.57* 3.13*  HGB 7.2* 7.0* 8.8*  HCT 22.1* 21.7* 27.1*  MCV 83.7 84.4 86.6  MCH 27.3 27.2 28.1  MCHC 32.6 32.3 32.5  RDW 16.1* 16.3* 16.0*  PLT 234 223 223   Thyroid No results for input(s): TSH, FREET4 in the last 168 hours.  BNPNo results for input(s): BNP, PROBNP in the last 168 hours.  DDimer No results for input(s): DDIMER in the last 168 hours.   Radiology  ECHOCARDIOGRAM COMPLETE BUBBLE STUDY Result Date: 11/13/2024    ECHOCARDIOGRAM REPORT   Patient Name:   Natasha Mcknight Date of Exam: 11/13/2024 Medical Rec #:  [id number]          Height:       64.0 in Accession #:  [id number]         Weight:       145.0 lb Date of Birth:  [birth year]         BSA:          1.706 m Patient Age:    58 years           BP:           121/62 mmHg Patient Gender: F                  HR:           101 bpm. Exam Location:  Inpatient Procedure: 2D Echo, Cardiac Doppler and Color Doppler (Both Spectral and Color            Flow Doppler were utilized during procedure). Indications:    Anemia  History:        Patient has no prior history of Echocardiogram examinations.                 Risk Factors:Hypertension.                 Aortic Valve: 19 mm Regent mechanical valve is present in the                 aortic position. Procedure Date: 9/25.                 Mitral Valve: 23 mm Regent mechanical valve valve is present in                 the mitral position.  Sonographer:    Merlynn Argyle Referring Phys: [id number] JERILYNN GORMAN LATER  Sonographer Comments: Image acquisition challenging due to patient body habitus. IMPRESSIONS   1. Left ventricular ejection fraction, by estimation, is 55 to 60%. The left ventricle has normal function. The left ventricle has no regional wall motion abnormalities. Left ventricular diastolic parameters are indeterminate.  2. Right ventricular systolic function is normal. The right ventricular size is mildly enlarged. There is normal pulmonary artery systolic pressure.  3. Left atrial size was moderately dilated.  4. The mitral valve has been repaired/replaced. No evidence of mitral valve regurgitation. No evidence of mitral stenosis. There is a 23 mm Regent mechanical valve present in the mitral position. Echo findings are consistent with normal structure and function of the mitral valve prosthesis.  5. Tricuspid valve regurgitation is moderate.  6. The aortic valve has been repaired/replaced. Aortic valve regurgitation is not visualized. No aortic stenosis is present. There is a 19 mm Regent mechanical valve present in the aortic position. Procedure Date: 9/25. Echo findings are consistent with  normal structure and function of the aortic valve prosthesis.  7. The inferior vena cava is normal in size with greater than 50% respiratory variability, suggesting right atrial pressure of 3 mmHg.  8. Agitated saline contrast bubble study was negative, with no evidence of any interatrial shunt. Conclusion(s)/Recommendation(s): No intracardiac source of embolism detected on this transthoracic study. Consider a transesophageal echocardiogram to exclude cardiac source of embolism if clinically indicated. FINDINGS  Left Ventricle: Left ventricular ejection fraction, by estimation, is 55 to 60%. The left ventricle has normal function. The left ventricle has no regional wall motion abnormalities. The left ventricular internal cavity size was normal in size. There is  no left ventricular hypertrophy. Left ventricular diastolic parameters are indeterminate. Right Ventricle: The right ventricular size is mildly enlarged. No  increase in right ventricular wall thickness. Right ventricular systolic  function is normal. There is normal pulmonary artery systolic pressure. The tricuspid regurgitant velocity is 2.84  m/s, and with an assumed right atrial pressure of 3 mmHg, the estimated right ventricular systolic pressure is 35.3 mmHg. Left Atrium: Left atrial size was moderately dilated. Right Atrium: Right atrial size was normal in size. Pericardium: There is no evidence of pericardial effusion. Mitral Valve: The mitral valve has been repaired/replaced. No evidence of mitral valve regurgitation. There is a 23 mm Regent mechanical valve present in the mitral position. Echo findings are consistent with normal structure and function of the mitral valve prosthesis. No evidence of mitral valve stenosis. MV peak gradient, 19.7 mmHg. The mean mitral valve gradient is 11.0 mmHg. Tricuspid Valve: The tricuspid valve is normal in structure. Tricuspid valve regurgitation is moderate . No evidence of tricuspid stenosis. Aortic Valve: The aortic valve has been repaired/replaced. Aortic valve regurgitation is not visualized. No aortic stenosis is present. Aortic valve mean gradient measures 12.0 mmHg. Aortic valve peak gradient measures 21.7 mmHg. Aortic valve area, by VTI measures 0.65 cm. There is a 19 mm Regent mechanical valve present in the aortic position. Procedure Date: 9/25. Echo findings are consistent with normal structure and function of the aortic valve prosthesis. Pulmonic Valve: The pulmonic valve was normal in structure. Pulmonic valve regurgitation is not visualized. No evidence of pulmonic stenosis. Aorta: The aortic root is normal in size and structure. Venous: The inferior vena cava is normal in size with greater than 50% respiratory variability, suggesting right atrial pressure of 3 mmHg. IAS/Shunts: No atrial level shunt detected by color flow Doppler. Agitated saline contrast was given intravenously to evaluate for intracardiac  shunting. Agitated saline contrast bubble study was negative, with no evidence of any interatrial shunt.  LEFT VENTRICLE PLAX 2D LVIDd:         4.60 cm LVIDs:         2.80 cm LV PW:         0.80 cm LV IVS:        0.80 cm LVOT diam:     1.50 cm LV SV:         28 LV SV Index:   16 LVOT Area:     1.77 cm  RIGHT VENTRICLE             IVC RV Basal diam:  4.30 cm     IVC diam: 1.70 cm RV Mid diam:    3.60 cm RV S prime:     10.40 cm/s TAPSE (M-mode): 1.1 cm LEFT ATRIUM             Index        RIGHT ATRIUM           Index LA diam:        4.30 cm 2.52 cm/m   RA Area:     24.60 cm LA Vol (A2C):   77.8 ml 45.59 ml/m  RA Volume:   88.60 ml  51.92 ml/m LA Vol (A4C):   73.5 ml 43.07 ml/m LA Biplane Vol: 78.6 ml 46.06 ml/m  AORTIC VALVE AV Area (Vmax):    0.65 cm AV Area (Vmean):   0.66 cm AV Area (VTI):     0.65 cm AV Vmax:           233.00 cm/s AV Vmean:          166.000 cm/s AV VTI:            0.430 m AV Peak Grad:  21.7 mmHg AV Mean Grad:      12.0 mmHg LVOT Vmax:         85.20 cm/s LVOT Vmean:        61.900 cm/s LVOT VTI:          0.159 m LVOT/AV VTI ratio: 0.37  AORTA Ao Root diam: 3.00 cm Ao Asc diam:  3.40 cm MITRAL VALVE              TRICUSPID VALVE MV Area VTI:  0.68 cm    TR Peak grad:   32.3 mmHg MV Peak grad: 19.7 mmHg   TR Vmax:        284.00 cm/s MV Mean grad: 11.0 mmHg MV Vmax:      2.22 m/s    SHUNTS MV Vmean:     154.0 cm/s  Systemic VTI:  0.16 m                           Systemic Diam: 1.50 cm Oneil Parchment MD Electronically signed by Oneil Parchment MD Signature Date/Time: 11/13/2024/4:38:42 PM    Final    CT Head Wo Contrast Result Date: 11/12/2024 EXAM: CT HEAD WITHOUT 11/12/2024 10:27:43 AM TECHNIQUE: CT of the head was performed without the administration of intravenous contrast. Automated exposure control, iterative reconstruction, and/or weight based adjustment of the mA/kV was utilized to reduce the radiation dose to as low as reasonably achievable. COMPARISON: None available. CLINICAL  HISTORY: Vertigo, peripheral. FINDINGS: BRAIN AND VENTRICLES: No acute intracranial hemorrhage. No mass effect or midline shift. No extra-axial fluid collection. No evidence of acute infarct. No hydrocephalus. Atherosclerotic calcifications are present within the cavernous internal carotid arteries. ORBITS: No acute abnormality. SINUSES AND MASTOIDS: No acute abnormality. SOFT TISSUES AND SKULL: No acute skull fracture. No acute soft tissue abnormality. IMPRESSION: 1. No acute intracranial abnormality. Electronically signed by: Morgane Naveau MD 11/12/2024 10:32 AM EST RP Workstation: [id number]    Patient Profile    59 y.o. female  with history of HTN, HLD, cardiomyopathy and severe AS/MR s/p recent surgical AVR/MVR with mechanical valves. She is on coumadin chronically. She is admitted with weakness and fatigue and found to have Hgb 4.4 due to GI bleeding. INR around 3.0.   Assessment & Plan   Aortic/Mitral valve disease s/p MVR/AVR: She is on coumadin chronically with mechanical AVR and mechanical MVR with goal INR above 2.5. She comes in with anemia and GI bleeding with INR around 3.0. No evidence of hemolysis on labs. Coumadin is on hold.  Start IV Heparin  when INR under 2.5. Her INR is 3.0 today Echo yesterday with normal LV function and normal function of both valves   GI bleeding/Anemia: Pt is on chronic coumadin therapy. GI workup pending when INR comes down  Signed, Lonni Cash, MD  11/14/2024, 8:56 AM    "

## 2024-11-14 NOTE — Progress Notes (Signed)
 [facility] ROUNDING NOTE   Subjective: No issues overnight.  Patient feeling well this morning.  Hemoglobin responded appropriately to transfusion.  INR unchanged at 3 this morning.   Objective: Vital signs in last 24 hours: Temp:  [97.9 F (36.6 C)-98.8 F (37.1 C)] 98.7 F (37.1 C) (01/06 0744) Pulse Rate:  [99-105] 101 (01/06 0906) Resp:  [16-22] 18 (01/06 0744) BP: (111-139)/(61-72) 127/69 (01/06 0906) SpO2:  [88 %-100 %] 98 % (01/06 0906) Last BM Date : 11/11/24 General: NAD, pleasant African-American female Lungs:  CTA b/l, no w/r/r Heart: Tachycardic, mechanical click, no m/r/g Abdomen:  Soft, NT, ND, +BS Ext:  No c/c/e    Intake/Output from previous day: 01/05 0701 - 01/06 0700 In: 394.7 [Blood:394.7] Out: -  Intake/Output this shift: Total I/O In: 120 [P.O.:120] Out: -    Lab Results: Recent Labs    11/13/24 0431 11/13/24 0650 11/13/24 1308  WBC 8.9 9.0 8.5  HGB 7.2* 7.0* 8.8*  PLT 234 223 223  MCV 83.7 84.4 86.6   BMET Recent Labs    11/12/24 0945 11/13/24 0431  NA 141 139  K 3.5 3.9  CL 104 105  CO2 24 25  GLUCOSE 195* 107*  BUN 26* 23*  CREATININE 1.18* 1.14*  CALCIUM  9.8 8.6*   LFT Recent Labs    11/12/24 0945 11/13/24 0431  PROT 6.6 5.9*  ALBUMIN 4.1 3.8  AST 23 20  ALT 21 17  ALKPHOS 96 68  BILITOT 0.4 0.7   PT/INR Recent Labs    11/13/24 0431 11/14/24 0216  INR 3.0* 3.0*      Imaging/Other results: ECHOCARDIOGRAM COMPLETE BUBBLE STUDY Result Date: 11/13/2024    ECHOCARDIOGRAM REPORT   Patient Name:   Natasha Mcknight Date of Exam: 11/13/2024 Medical Rec #:  [id number]          Height:       64.0 in Accession #:    [id number]         Weight:       145.0 lb Date of Birth:  02-28-1966         BSA:          1.706 m Patient Age:    58 years           BP:           121/62 mmHg Patient Gender: F                  HR:           101 bpm. Exam Location:  Inpatient Procedure: 2D Echo, Cardiac Doppler and Color  Doppler (Both Spectral and Color            Flow Doppler were utilized during procedure). Indications:    Anemia  History:        Patient has no prior history of Echocardiogram examinations.                 Risk Factors:Hypertension.                 Aortic Valve: 19 mm Regent mechanical valve is present in the                 aortic position. Procedure Date: 9/25.                 Mitral Valve: 23 mm Regent mechanical valve valve is present in  the mitral position.  Sonographer:    Merlynn Argyle Referring Phys: [id number] JERILYNN GORMAN LATER  Sonographer Comments: Image acquisition challenging due to patient body habitus. IMPRESSIONS  1. Left ventricular ejection fraction, by estimation, is 55 to 60%. The left ventricle has normal function. The left ventricle has no regional wall motion abnormalities. Left ventricular diastolic parameters are indeterminate.  2. Right ventricular systolic function is normal. The right ventricular size is mildly enlarged. There is normal pulmonary artery systolic pressure.  3. Left atrial size was moderately dilated.  4. The mitral valve has been repaired/replaced. No evidence of mitral valve regurgitation. No evidence of mitral stenosis. There is a 23 mm Regent mechanical valve present in the mitral position. Echo findings are consistent with normal structure and function of the mitral valve prosthesis.  5. Tricuspid valve regurgitation is moderate.  6. The aortic valve has been repaired/replaced. Aortic valve regurgitation is not visualized. No aortic stenosis is present. There is a 19 mm Regent mechanical valve present in the aortic position. Procedure Date: 9/25. Echo findings are consistent with  normal structure and function of the aortic valve prosthesis.  7. The inferior vena cava is normal in size with greater than 50% respiratory variability, suggesting right atrial pressure of 3 mmHg.  8. Agitated saline contrast bubble study was negative, with no evidence of any  interatrial shunt. Conclusion(s)/Recommendation(s): No intracardiac source of embolism detected on this transthoracic study. Consider a transesophageal echocardiogram to exclude cardiac source of embolism if clinically indicated. FINDINGS  Left Ventricle: Left ventricular ejection fraction, by estimation, is 55 to 60%. The left ventricle has normal function. The left ventricle has no regional wall motion abnormalities. The left ventricular internal cavity size was normal in size. There is  no left ventricular hypertrophy. Left ventricular diastolic parameters are indeterminate. Right Ventricle: The right ventricular size is mildly enlarged. No increase in right ventricular wall thickness. Right ventricular systolic function is normal. There is normal pulmonary artery systolic pressure. The tricuspid regurgitant velocity is 2.84  m/s, and with an assumed right atrial pressure of 3 mmHg, the estimated right ventricular systolic pressure is 35.3 mmHg. Left Atrium: Left atrial size was moderately dilated. Right Atrium: Right atrial size was normal in size. Pericardium: There is no evidence of pericardial effusion. Mitral Valve: The mitral valve has been repaired/replaced. No evidence of mitral valve regurgitation. There is a 23 mm Regent mechanical valve present in the mitral position. Echo findings are consistent with normal structure and function of the mitral valve prosthesis. No evidence of mitral valve stenosis. MV peak gradient, 19.7 mmHg. The mean mitral valve gradient is 11.0 mmHg. Tricuspid Valve: The tricuspid valve is normal in structure. Tricuspid valve regurgitation is moderate . No evidence of tricuspid stenosis. Aortic Valve: The aortic valve has been repaired/replaced. Aortic valve regurgitation is not visualized. No aortic stenosis is present. Aortic valve mean gradient measures 12.0 mmHg. Aortic valve peak gradient measures 21.7 mmHg. Aortic valve area, by VTI measures 0.65 cm. There is a 19 mm Regent  mechanical valve present in the aortic position. Procedure Date: 9/25. Echo findings are consistent with normal structure and function of the aortic valve prosthesis. Pulmonic Valve: The pulmonic valve was normal in structure. Pulmonic valve regurgitation is not visualized. No evidence of pulmonic stenosis. Aorta: The aortic root is normal in size and structure. Venous: The inferior vena cava is normal in size with greater than 50% respiratory variability, suggesting right atrial pressure of 3 mmHg. IAS/Shunts: No atrial level shunt  detected by color flow Doppler. Agitated saline contrast was given intravenously to evaluate for intracardiac shunting. Agitated saline contrast bubble study was negative, with no evidence of any interatrial shunt.  LEFT VENTRICLE PLAX 2D LVIDd:         4.60 cm LVIDs:         2.80 cm LV PW:         0.80 cm LV IVS:        0.80 cm LVOT diam:     1.50 cm LV SV:         28 LV SV Index:   16 LVOT Area:     1.77 cm  RIGHT VENTRICLE             IVC RV Basal diam:  4.30 cm     IVC diam: 1.70 cm RV Mid diam:    3.60 cm RV S prime:     10.40 cm/s TAPSE (M-mode): 1.1 cm LEFT ATRIUM             Index        RIGHT ATRIUM           Index LA diam:        4.30 cm 2.52 cm/m   RA Area:     24.60 cm LA Vol (A2C):   77.8 ml 45.59 ml/m  RA Volume:   88.60 ml  51.92 ml/m LA Vol (A4C):   73.5 ml 43.07 ml/m LA Biplane Vol: 78.6 ml 46.06 ml/m  AORTIC VALVE AV Area (Vmax):    0.65 cm AV Area (Vmean):   0.66 cm AV Area (VTI):     0.65 cm AV Vmax:           233.00 cm/s AV Vmean:          166.000 cm/s AV VTI:            0.430 m AV Peak Grad:      21.7 mmHg AV Mean Grad:      12.0 mmHg LVOT Vmax:         85.20 cm/s LVOT Vmean:        61.900 cm/s LVOT VTI:          0.159 m LVOT/AV VTI ratio: 0.37  AORTA Ao Root diam: 3.00 cm Ao Asc diam:  3.40 cm MITRAL VALVE              TRICUSPID VALVE MV Area VTI:  0.68 cm    TR Peak grad:   32.3 mmHg MV Peak grad: 19.7 mmHg   TR Vmax:        284.00 cm/s MV Mean grad:  11.0 mmHg MV Vmax:      2.22 m/s    SHUNTS MV Vmean:     154.0 cm/s  Systemic VTI:  0.16 m                           Systemic Diam: 1.50 cm Oneil Parchment MD Electronically signed by Oneil Parchment MD Signature Date/Time: 11/13/2024/4:38:42 PM    Final       Assessment and Plan:  59 year old female with history of idiopathic cardiomyopathy, severe MR, atrial stenosis status post mechanical mitral and aortic valve replacement admitted with severe symptomatic anemia with hemoglobin 4.4 down from baseline of around 10.  She had been having some recent upper GI symptoms of nausea and epigastric pain and had reported dark stools recently. Iron panel consistent with severe iron  deficiency anemia.  Fecal occult blood test positive.  Hemoglobin responded well to 3 units PRBCs. No previous upper endoscopy.  Patient has had remote colonoscopy, but it has been many years.  Symptomatic iron deficiency anemia, suspected GI bleed -No active bleeding since admission - Hemoglobin responded appropriately to 3 units PRBCs transfusion - Low suspicion for hemolysis based on haptoglobin, LDH and iron indices - Will prepare for EGD and colonoscopy once INR close to 2.5 - Regular diet today - Repeat INR tomorrow, start heparin  drip when INR 2.5 - Tentatively start clear liquid diet tomorrow morning pending INR results  Status post mechanical aortic and mitral valve - Echocardiogram showed normal valve position and function  CKD 3 - At baseline  Diastolic CHF - Echo yesterday with indeterminate diastolic parameters - Patient appears euvolemic   Natasha FORBES Holt, MD  11/14/2024, 10:28 AM [facility]   Moderate complex medical decision making (this includes chart review, review of results, face-to-face time used for counseling as well as treatment plan and follow-up. The patient was provided an opportunity to ask questions and all were answered. The patient agreed with the plan and demonstrated an  understanding of the instructions

## 2024-11-14 NOTE — Plan of Care (Signed)
  Problem: Education: Goal: Knowledge of General Education information will improve Description: Including pain rating scale, medication(s)/side effects and non-pharmacologic comfort measures Outcome: Progressing   Problem: Clinical Measurements: Goal: Respiratory complications will improve Outcome: Progressing Goal: Cardiovascular complication will be avoided Outcome: Progressing   Problem: Nutrition: Goal: Adequate nutrition will be maintained Outcome: Progressing   Problem: Elimination: Goal: Will not experience complications related to bowel motility Outcome: Progressing Goal: Will not experience complications related to urinary retention Outcome: Progressing   Problem: Skin Integrity: Goal: Risk for impaired skin integrity will decrease Outcome: Progressing

## 2024-11-15 ENCOUNTER — Encounter (HOSPITAL_COMMUNITY)
Admission: RE | Admit: 2024-11-15 | Discharge: 2024-11-15 | Disposition: A | Source: Ambulatory Visit | Attending: Cardiology

## 2024-11-15 DIAGNOSIS — D649 Anemia, unspecified: Secondary | ICD-10-CM | POA: Diagnosis not present

## 2024-11-15 LAB — HEMOGLOBIN AND HEMATOCRIT, BLOOD
HCT: 24.7 % — ABNORMAL LOW (ref 36.0–46.0)
Hemoglobin: 8.4 g/dL — ABNORMAL LOW (ref 12.0–15.0)

## 2024-11-15 LAB — PROTIME-INR
INR: 2.1 — ABNORMAL HIGH (ref 0.8–1.2)
Prothrombin Time: 24.4 s — ABNORMAL HIGH (ref 11.4–15.2)

## 2024-11-15 LAB — BASIC METABOLIC PANEL WITH GFR
Anion gap: 7 (ref 5–15)
BUN: 18 mg/dL (ref 6–20)
CO2: 24 mmol/L (ref 22–32)
Calcium: 8.5 mg/dL — ABNORMAL LOW (ref 8.9–10.3)
Chloride: 105 mmol/L (ref 98–111)
Creatinine, Ser: 1.11 mg/dL — ABNORMAL HIGH (ref 0.44–1.00)
GFR, Estimated: 57 mL/min — ABNORMAL LOW
Glucose, Bld: 98 mg/dL (ref 70–99)
Potassium: 4.3 mmol/L (ref 3.5–5.1)
Sodium: 136 mmol/L (ref 135–145)

## 2024-11-15 LAB — HEMOGLOBIN A1C
Hgb A1c MFr Bld: 5.5 % (ref 4.8–5.6)
Mean Plasma Glucose: 111.15 mg/dL

## 2024-11-15 LAB — HEPARIN LEVEL (UNFRACTIONATED): Heparin Unfractionated: 0.47 [IU]/mL (ref 0.30–0.70)

## 2024-11-15 MED ORDER — METOPROLOL TARTRATE 5 MG/5ML IV SOLN: 2.5000 mg | INTRAVENOUS | Status: DC | PRN

## 2024-11-15 MED ORDER — SIMETHICONE 80 MG PO CHEW
240.0000 mg | CHEWABLE_TABLET | Freq: Once | ORAL | Status: AC
  Administered 2024-11-15: 240 mg via ORAL
  Filled 2024-11-15: qty 3

## 2024-11-15 MED ORDER — NA SULFATE-K SULFATE-MG SULF 17.5-3.13-1.6 GM/177ML PO SOLN
0.5000 | Freq: Once | ORAL | Status: AC
  Administered 2024-11-15: 177 mL via ORAL

## 2024-11-15 MED ORDER — HEPARIN (PORCINE) 25000 UT/250ML-% IV SOLN
1000.0000 [IU]/h | INTRAVENOUS | Status: AC
  Administered 2024-11-15: 1000 [IU]/h via INTRAVENOUS
  Filled 2024-11-15: qty 250

## 2024-11-15 MED ORDER — NA SULFATE-K SULFATE-MG SULF 17.5-3.13-1.6 GM/177ML PO SOLN
0.5000 | Freq: Once | ORAL | Status: AC
  Administered 2024-11-15: 177 mL via ORAL
  Filled 2024-11-15: qty 1

## 2024-11-15 MED ORDER — SODIUM CHLORIDE 0.9 % IV SOLN: INTRAVENOUS | Status: AC

## 2024-11-15 NOTE — Progress Notes (Signed)
 [facility] ROUNDING NOTE   Subjective: No new complaints per patient today.  Patient slept well.  Hemoglobin stable.  INR came down to 2.1 and she was started on heparin  drip.   Objective: Vital signs in last 24 hours: Temp:  [97.3 F (36.3 C)-98.9 F (37.2 C)] 97.7 F (36.5 C) (01/07 1614) Pulse Rate:  [94-106] 98 (01/07 1614) Resp:  [16-20] 16 (01/07 1614) BP: (106-125)/(63-74) 125/74 (01/07 1614) SpO2:  [97 %-100 %] 97 % (01/07 1614) Last BM Date : 11/14/24 General: NAD, pleasant African-American female Lungs:  CTA b/l, no w/r/r Heart:  RRR, mechanical click no m/r/g Abdomen:  Soft, NT, ND, +BS Ext:  No c/c/e    Intake/Output from previous day: 01/06 0701 - 01/07 0700 In: 760 [P.O.:760] Out: -  Intake/Output this shift: No intake/output data recorded.   Lab Results: Recent Labs    11/13/24 0431 11/13/24 0650 11/13/24 1308 11/15/24 1158  WBC 8.9 9.0 8.5  --   HGB 7.2* 7.0* 8.8* 8.4*  PLT 234 223 223  --   MCV 83.7 84.4 86.6  --    BMET Recent Labs    11/13/24 0431 11/15/24 1158  NA 139 136  K 3.9 4.3  CL 105 105  CO2 25 24  GLUCOSE 107* 98  BUN 23* 18  CREATININE 1.14* 1.11*  CALCIUM  8.6* 8.5*   LFT Recent Labs    11/13/24 0431  PROT 5.9*  ALBUMIN 3.8  AST 20  ALT 17  ALKPHOS 68  BILITOT 0.7   PT/INR Recent Labs    11/14/24 0216 11/15/24 0157  INR 3.0* 2.1*      Imaging/Other results: No results found.    Assessment and Plan:  59 year old female with history of idiopathic cardiomyopathy, severe MR, atrial stenosis status post mechanical mitral and aortic valve replacement admitted with severe symptomatic anemia with hemoglobin 4.4 down from baseline of around 10.  She had been having some recent upper GI symptoms of nausea and epigastric pain and had reported dark stools recently. Iron panel consistent with severe iron deficiency anemia.  Fecal occult blood test positive.  Hemoglobin responded well to 3 units  PRBCs. No previous upper endoscopy.  Patient has had remote colonoscopy, but it has been many years.   Symptomatic iron deficiency anemia, suspected GI bleed -No active bleeding since admission - Hemoglobin responded appropriately to 3 units PRBCs transfusion - Low suspicion for hemolysis based on haptoglobin, LDH and iron indices -Plan for EGD and colonoscopy tomorrow - Clear liquid diet today, start bowel prep this evening, n.p.o. except bowel prep after midnight - Continue heparin  drip, stop 4 hours before procedure (procedure scheduled for 730) - Further recommendations pending endoscopic findings   Status post mechanical aortic and mitral valve - Echocardiogram showed normal valve position and function   CKD 3 - At baseline   Diastolic CHF - Echo yesterday with indeterminate diastolic parameters - Patient appears euvolemic    [name], MD  11/15/2024, 8:00 PM [facility]

## 2024-11-15 NOTE — H&P (View-Only) (Signed)
 [facility] ROUNDING NOTE   Subjective: No new complaints per patient today.  Patient slept well.  Hemoglobin stable.  INR came down to 2.1 and she was started on heparin  drip.   Objective: Vital signs in last 24 hours: Temp:  [97.3 F (36.3 C)-98.9 F (37.2 C)] 97.7 F (36.5 C) (01/07 1614) Pulse Rate:  [94-106] 98 (01/07 1614) Resp:  [16-20] 16 (01/07 1614) BP: (106-125)/(63-74) 125/74 (01/07 1614) SpO2:  [97 %-100 %] 97 % (01/07 1614) Last BM Date : 11/14/24 General: NAD, pleasant African-American female Lungs:  CTA b/l, no w/r/r Heart:  RRR, mechanical click no m/r/g Abdomen:  Soft, NT, ND, +BS Ext:  No c/c/e    Intake/Output from previous day: 01/06 0701 - 01/07 0700 In: 760 [P.O.:760] Out: -  Intake/Output this shift: No intake/output data recorded.   Lab Results: Recent Labs    11/13/24 0431 11/13/24 0650 11/13/24 1308 11/15/24 1158  WBC 8.9 9.0 8.5  --   HGB 7.2* 7.0* 8.8* 8.4*  PLT 234 223 223  --   MCV 83.7 84.4 86.6  --    BMET Recent Labs    11/13/24 0431 11/15/24 1158  NA 139 136  K 3.9 4.3  CL 105 105  CO2 25 24  GLUCOSE 107* 98  BUN 23* 18  CREATININE 1.14* 1.11*  CALCIUM  8.6* 8.5*   LFT Recent Labs    11/13/24 0431  PROT 5.9*  ALBUMIN 3.8  AST 20  ALT 17  ALKPHOS 68  BILITOT 0.7   PT/INR Recent Labs    11/14/24 0216 11/15/24 0157  INR 3.0* 2.1*      Imaging/Other results: No results found.    Assessment and Plan:  59 year old female with history of idiopathic cardiomyopathy, severe MR, atrial stenosis status post mechanical mitral and aortic valve replacement admitted with severe symptomatic anemia with hemoglobin 4.4 down from baseline of around 10.  She had been having some recent upper GI symptoms of nausea and epigastric pain and had reported dark stools recently. Iron  panel consistent with severe iron  deficiency anemia.  Fecal occult blood test positive.  Hemoglobin responded well to 3 units  PRBCs. No previous upper endoscopy.  Patient has had remote colonoscopy, but it has been many years.   Symptomatic iron  deficiency anemia, suspected GI bleed -No active bleeding since admission - Hemoglobin responded appropriately to 3 units PRBCs transfusion - Low suspicion for hemolysis based on haptoglobin, LDH and iron  indices -Plan for EGD and colonoscopy tomorrow - Clear liquid diet today, start bowel prep this evening, n.p.o. except bowel prep after midnight - Continue heparin  drip, stop 4 hours before procedure (procedure scheduled for 730) - Further recommendations pending endoscopic findings   Status post mechanical aortic and mitral valve - Echocardiogram showed normal valve position and function   CKD 3 - At baseline   Diastolic CHF - Echo yesterday with indeterminate diastolic parameters - Patient appears euvolemic    [name], MD  11/15/2024, 8:00 PM [facility]

## 2024-11-15 NOTE — Progress Notes (Addendum)
 ANTICOAGULATION CONSULT NOTE  Pharmacy Consult for Heparin  Indication: mechanical valve  Allergies[1]  Patient Measurements: Height: 5' 4 (162.6 cm) Weight: 65.8 kg (145 lb) IBW/kg (Calculated) : 54.7 Heparin  dosing weight: 65.8 kg  Vital Signs: Temp: 98.5 F (36.9 C) (01/06 2346) Temp Source: Oral (01/06 2346) BP: 119/68 (01/06 2346) Pulse Rate: 106 (01/06 2346)  Labs: Recent Labs    11/12/24 0945 11/12/24 1143 11/13/24 0431 11/13/24 0650 11/13/24 1308 11/14/24 0216 11/15/24 0157  HGB 4.4*   < > 7.2* 7.0* 8.8*  --   --   HCT 15.2*   < > 22.1* 21.7* 27.1*  --   --   PLT 250   < > 234 223 223  --   --   LABPROT  --    < > 32.7*  --   --  32.2* 24.4*  INR  --    < > 3.0*  --   --  3.0* 2.1*  CREATININE 1.18*  --  1.14*  --   --   --   --    < > = values in this interval not displayed.    Estimated Creatinine Clearance: 50.2 mL/min (A) (by C-G formula based on SCr of 1.14 mg/dL (H)).   Assessment: 17 yof with a history of HTN, GERD, s/p AVR on warfarin. Patient is presenting with symptomatic anemia. Patient taking warfarin prior to arrival. Home dose is 5mg  daily. Last taken 11/11/24. Heparin  per pharmacy consult placed for mechanical valve.  INR 2.1-subtherapeutic Previous CBC stable this morning s/p pRBC. No overt s/sx bleeding. Will initiate heparin  at this time  Goal of Therapy:  INR Goal 2.5-3.5 HL 0.3-0.7 Monitor platelets by anticoagulation protocol: Yes  Plan:  Heparin  gtt at 1000 units/hr 6h heparin  level F/u anemia work-up and plan to restart warfarin F/u plans for EGD and colonoscopy   Thank you for allowing pharmacy to participate in this patient's care.  Lynwood Poplar, PharmD, BCPS Clinical Pharmacist 11/15/2024 2:33 AM        [1]  Allergies Allergen Reactions   Ace Inhibitors Cough and Shortness Of Breath   Vancomycin Anaphylaxis and Other (See Comments)    Other Reaction: severe hypotension   Iodine    Latex

## 2024-11-15 NOTE — Progress Notes (Addendum)
 ANTICOAGULATION CONSULT NOTE  Pharmacy Consult for Heparin  Indication: mechanical valve  Allergies[1]  Patient Measurements: Height: 5' 4 (162.6 cm) Weight: 65.8 kg (145 lb) IBW/kg (Calculated) : 54.7 Heparin  dosing weight: 65.8 kg  Vital Signs: Temp: 98 F (36.7 C) (01/07 0347) Temp Source: Oral (01/07 0347) BP: 119/68 (01/07 0347) Pulse Rate: 94 (01/07 0347)  Labs: Recent Labs    11/13/24 0431 11/13/24 0650 11/13/24 1308 11/14/24 0216 11/15/24 0157 11/15/24 0859  HGB 7.2* 7.0* 8.8*  --   --   --   HCT 22.1* 21.7* 27.1*  --   --   --   PLT 234 223 223  --   --   --   LABPROT 32.7*  --   --  32.2* 24.4*  --   INR 3.0*  --   --  3.0* 2.1*  --   HEPARINUNFRC  --   --   --   --   --  0.47  CREATININE 1.14*  --   --   --   --   --     Estimated Creatinine Clearance: 50.2 mL/min (A) (by C-G formula based on SCr of 1.14 mg/dL (H)).   Assessment: 20 yoF admitted with symptomatic anemia and suspected GIB. 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 Heparin  started this am with INR < 2.5.  Initial heparin  level therapeutic at 0.47. Spoke to GI - will stop heparin  at 0330 tomorrow am.  Goal of Therapy:  INR Goal 2.5-3.5 HL 0.3-0.7 Monitor platelets by anticoagulation protocol: Yes  Plan:  Heparin  1000 units/h - stop at 0330 tomorrow (1/8) Daily heparin  level and CBC  Ozell Jamaica, PharmD, BCPS, [facility] Clinical Pharmacist [phone] Please check AMION for all [facility] Pharmacy numbers 11/15/2024        [1]  Allergies Allergen Reactions   Ace Inhibitors Cough and Shortness Of Breath   Vancomycin Anaphylaxis and Other (See Comments)    Other Reaction: severe hypotension   Iodine    Latex

## 2024-11-15 NOTE — Progress Notes (Signed)
 " PROGRESS NOTE    Natasha Mcknight  FMW:[id number] DOB: [birth year] DOA: 11/12/2024 PCP: Vernon Velna SAUNDERS, MD  Chief Complaint  Patient presents with   Dizziness   Emesis    Brief Narrative:   59 year old female with HTN, GERD, status post mechanical and aortic valve replacement in September 2025 at [facility], comes into the hospital with weakness, dizziness. This has been going on roughly acutely over the last 3 to 4 days. She has been nauseous during that time. She denies any chest pains. She denies any frank bleeding in her stool but did notice that her stools are darker than normal. In the ED she was found to have Benny Deutschman hemoglobin of 4.4, fecal occult was positive. She was admitted to the hospital, and GI consulted. Of note, EDP reached out to [facility] cardiothoracic surgery who said that patient could be admitted here and get it 2D echo followed by TEE, and if any issues with the valve to reach back for potential transfer.   TEE deferred here after discussion with cardiology given lack of clinically significant hemolysis.   Now pending GI eval, upper and lower endoscopy planned for 1/8.  Assessment & Plan:   Principal Problem:   Symptomatic anemia Active Problems:   GERD   Essential hypertension with goal blood pressure less than 140/90   S/P mitral valve repair   H/O mitral valve replacement with mechanical valve   S/P AVR (aortic valve replacement)   Acquired hemolytic anemia (HCC)   Mitral valve disease   Chronic anticoagulation   Gastrointestinal hemorrhage   Myocardiopathy (HCC)  Symptomatic anemia, positive FOBT Iron Deficiency patient presented to the hospital with Natasha Mcknight hemoglobin of 4.4.   Haptoglobin normal, bilirubin wnl, LDH is elevated - not suggestive of hemolysis  + Gastroenterology consulted, appreciate input.  Planning for upper and lower endoscopy 1/8. - s/p 3 units pRBC - significant iron def anemia, ferritin 11 with low iron.  Would benefit from IV iron  prior to discharge.  Normal B12, folate. - continue to monitor H/H - maintain active type and screen   History of mitral and aortic mechanical valves -replaced at [facility] in September 2024.  She was placed on warfarin since then.  She was not on anticoagulation prior to that. - INR is 2.1 today, now on heparin  gtt  - appreciate cardiology assistance, last note 1/6   Essential hypertension-continue home metoprolol , blood pressure is stable   CKD 3A Creatinine appears to be lower than recent baseline   Type 2 diabetes mellitus BG's ok, follow A1c here - not currently receiving any insulin   Chronic diastolic CHF  Echo with EF 55-60%, indeterminate diastolic parameters (notably grade III diastolic dysfunction on echo from 02/2024) Lasix currently on hold Strict I/O, daily weights  Hyperlipidemia lipitor    DVT prophylaxis: heparin  gtt Code Status: full Family Communication: none Disposition:   Status is: Inpatient Remains inpatient appropriate because: need for continued inpatient care   Consultants:  Cardiology gastroenterology  Procedures:  Echo IMPRESSIONS     1. Left ventricular ejection fraction, by estimation, is 55 to 60%. The  left ventricle has normal function. The left ventricle has no regional  wall motion abnormalities. Left ventricular diastolic parameters are  indeterminate.   2. Right ventricular systolic function is normal. The right ventricular  size is mildly enlarged. There is normal pulmonary artery systolic  pressure.   3. Left atrial size was moderately dilated.   4. The mitral valve has been repaired/replaced. No  evidence of mitral  valve regurgitation. No evidence of mitral stenosis. There is Natasha Mcknight 23 mm  Regent mechanical valve present in the mitral position. Echo findings are  consistent with normal structure and  function of the mitral valve prosthesis.   5. Tricuspid valve regurgitation is moderate.   6. The aortic valve has been  repaired/replaced. Aortic valve  regurgitation is not visualized. No aortic stenosis is present. There is Cambri Mcknight  19 mm Regent mechanical valve present in the aortic position. Procedure  Date: 9/25. Echo findings are consistent with   normal structure and function of the aortic valve prosthesis.   7. The inferior vena cava is normal in size with greater than 50%  respiratory variability, suggesting right atrial pressure of 3 mmHg.   8. Agitated saline contrast bubble study was negative, with no evidence  of any interatrial shunt.   Conclusion(s)/Recommendation(s): No intracardiac source of embolism  detected on this transthoracic study. Consider Torrance Frech transesophageal  echocardiogram to exclude cardiac source of embolism if clinically  indicated.   Antimicrobials:  Anti-infectives (From admission, onward)    None       Subjective: No complaints  Objective: Vitals:   11/14/24 2200 11/14/24 2346 11/15/24 0347 11/15/24 1211  BP: 106/63 119/68 119/68 117/68  Pulse: (!) 103 (!) 106 94 96  Resp: 20 19 16 16   Temp: 98.9 F (37.2 C) 98.5 F (36.9 C) 98 F (36.7 C) (!) 97.3 F (36.3 C)  TempSrc: Oral Oral Oral Oral  SpO2: 99% 99% 100% 100%  Weight:      Height:        Intake/Output Summary (Last 24 hours) at 11/15/2024 1452 Last data filed at 11/15/2024 1204 Gross per 24 hour  Intake 325.92 ml  Output --  Net 325.92 ml   Filed Weights   11/12/24 0933  Weight: 65.8 kg    Examination:  General exam: Appears calm and comfortable - eating an italian ice Respiratory system: Clear to auscultation. Respiratory effort normal. Cardiovascular system: RRR, click Central nervous system: Alert and oriented. No focal neurological deficits. Extremities: no LEE   Data Reviewed: I have personally reviewed following labs and imaging studies  CBC: Recent Labs  Lab 11/12/24 0945 11/12/24 1143 11/12/24 2118 11/13/24 0431 11/13/24 0650 11/13/24 1308 11/15/24 1158  WBC 7.4 9.5 9.2 8.9  9.0 8.5  --   NEUTROABS 5.8  --   --   --  6.2  --   --   HGB 4.4* 4.6* 7.3* 7.2* 7.0* 8.8* 8.4*  HCT 15.2* 16.3* 22.8* 22.1* 21.7* 27.1* 24.7*  MCV 83.5 86.2 85.7 83.7 84.4 86.6  --   PLT 250 264 236 234 223 223  --     Basic Metabolic Panel: Recent Labs  Lab 11/12/24 0945 11/13/24 0431 11/15/24 1158  NA 141 139 136  K 3.5 3.9 4.3  CL 104 105 105  CO2 24 25 24   GLUCOSE 195* 107* 98  BUN 26* 23* 18  CREATININE 1.18* 1.14* 1.11*  CALCIUM  9.8 8.6* 8.5*    GFR: Estimated Creatinine Clearance: 51.5 mL/min (Saina Waage) (by C-G formula based on SCr of 1.11 mg/dL (H)).  Liver Function Tests: Recent Labs  Lab 11/12/24 0945 11/13/24 0431  AST 23 20  ALT 21 17  ALKPHOS 96 68  BILITOT 0.4 0.7  PROT 6.6 5.9*  ALBUMIN 4.1 3.8    CBG: No results for input(s): GLUCAP in the last 168 hours.   No results found for this or any previous visit (  from the past 240 hours).       Radiology Studies: ECHOCARDIOGRAM COMPLETE BUBBLE STUDY Result Date: 11/13/2024    ECHOCARDIOGRAM REPORT   Patient Name:   Makaila Tomeo Date of Exam: 11/13/2024 Medical Rec #:  [id number]          Height:       64.0 in Accession #:    [id number]         Weight:       145.0 lb Date of Birth:  [birth year]         BSA:          1.706 m Patient Age:    58 years           BP:           121/62 mmHg Patient Gender: F                  HR:           101 bpm. Exam Location:  Inpatient Procedure: 2D Echo, Cardiac Doppler and Color Doppler (Both Spectral and Color            Flow Doppler were utilized during procedure). Indications:    Anemia  History:        Patient has no prior history of Echocardiogram examinations.                 Risk Factors:Hypertension.                 Aortic Valve: 19 mm Regent mechanical valve is present in the                 aortic position. Procedure Date: 9/25.                 Mitral Valve: 23 mm Regent mechanical valve valve is present in                 the mitral position.  Sonographer:    Merlynn Argyle Referring Phys: [id number] JERILYNN GORMAN LATER  Sonographer Comments: Image acquisition challenging due to patient body habitus. IMPRESSIONS  1. Left ventricular ejection fraction, by estimation, is 55 to 60%. The left ventricle has normal function. The left ventricle has no regional wall motion abnormalities. Left ventricular diastolic parameters are indeterminate.  2. Right ventricular systolic function is normal. The right ventricular size is mildly enlarged. There is normal pulmonary artery systolic pressure.  3. Left atrial size was moderately dilated.  4. The mitral valve has been repaired/replaced. No evidence of mitral valve regurgitation. No evidence of mitral stenosis. There is Louretta Tantillo 23 mm Regent mechanical valve present in the mitral position. Echo findings are consistent with normal structure and function of the mitral valve prosthesis.  5. Tricuspid valve regurgitation is moderate.  6. The aortic valve has been repaired/replaced. Aortic valve regurgitation is not visualized. No aortic stenosis is present. There is Simona Rocque 19 mm Regent mechanical valve present in the aortic position. Procedure Date: 9/25. Echo findings are consistent with  normal structure and function of the aortic valve prosthesis.  7. The inferior vena cava is normal in size with greater than 50% respiratory variability, suggesting right atrial pressure of 3 mmHg.  8. Agitated saline contrast bubble study was negative, with no evidence of any interatrial shunt. Conclusion(s)/Recommendation(s): No intracardiac source of embolism detected on this transthoracic study. Consider Malashia Kamaka transesophageal echocardiogram to exclude cardiac source of embolism if clinically indicated. FINDINGS  Left Ventricle: Left ventricular ejection  fraction, by estimation, is 55 to 60%. The left ventricle has normal function. The left ventricle has no regional wall motion abnormalities. The left ventricular internal cavity size was normal in size. There is  no left  ventricular hypertrophy. Left ventricular diastolic parameters are indeterminate. Right Ventricle: The right ventricular size is mildly enlarged. No increase in right ventricular wall thickness. Right ventricular systolic function is normal. There is normal pulmonary artery systolic pressure. The tricuspid regurgitant velocity is 2.84  m/s, and with an assumed right atrial pressure of 3 mmHg, the estimated right ventricular systolic pressure is 35.3 mmHg. Left Atrium: Left atrial size was moderately dilated. Right Atrium: Right atrial size was normal in size. Pericardium: There is no evidence of pericardial effusion. Mitral Valve: The mitral valve has been repaired/replaced. No evidence of mitral valve regurgitation. There is Kelsie Zaborowski 23 mm Regent mechanical valve present in the mitral position. Echo findings are consistent with normal structure and function of the mitral valve prosthesis. No evidence of mitral valve stenosis. MV peak gradient, 19.7 mmHg. The mean mitral valve gradient is 11.0 mmHg. Tricuspid Valve: The tricuspid valve is normal in structure. Tricuspid valve regurgitation is moderate . No evidence of tricuspid stenosis. Aortic Valve: The aortic valve has been repaired/replaced. Aortic valve regurgitation is not visualized. No aortic stenosis is present. Aortic valve mean gradient measures 12.0 mmHg. Aortic valve peak gradient measures 21.7 mmHg. Aortic valve area, by VTI measures 0.65 cm. There is Paulett Kaufhold 19 mm Regent mechanical valve present in the aortic position. Procedure Date: 9/25. Echo findings are consistent with normal structure and function of the aortic valve prosthesis. Pulmonic Valve: The pulmonic valve was normal in structure. Pulmonic valve regurgitation is not visualized. No evidence of pulmonic stenosis. Aorta: The aortic root is normal in size and structure. Venous: The inferior vena cava is normal in size with greater than 50% respiratory variability, suggesting right atrial pressure of 3  mmHg. IAS/Shunts: No atrial level shunt detected by color flow Doppler. Agitated saline contrast was given intravenously to evaluate for intracardiac shunting. Agitated saline contrast bubble study was negative, with no evidence of any interatrial shunt.  LEFT VENTRICLE PLAX 2D LVIDd:         4.60 cm LVIDs:         2.80 cm LV PW:         0.80 cm LV IVS:        0.80 cm LVOT diam:     1.50 cm LV SV:         28 LV SV Index:   16 LVOT Area:     1.77 cm  RIGHT VENTRICLE             IVC RV Basal diam:  4.30 cm     IVC diam: 1.70 cm RV Mid diam:    3.60 cm RV S prime:     10.40 cm/s TAPSE (M-mode): 1.1 cm LEFT ATRIUM             Index        RIGHT ATRIUM           Index LA diam:        4.30 cm 2.52 cm/m   RA Area:     24.60 cm LA Vol (A2C):   77.8 ml 45.59 ml/m  RA Volume:   88.60 ml  51.92 ml/m LA Vol (A4C):   73.5 ml 43.07 ml/m LA Biplane Vol: 78.6 ml 46.06 ml/m  AORTIC VALVE AV Area (Vmax):  0.65 cm AV Area (Vmean):   0.66 cm AV Area (VTI):     0.65 cm AV Vmax:           233.00 cm/s AV Vmean:          166.000 cm/s AV VTI:            0.430 m AV Peak Grad:      21.7 mmHg AV Mean Grad:      12.0 mmHg LVOT Vmax:         85.20 cm/s LVOT Vmean:        61.900 cm/s LVOT VTI:          0.159 m LVOT/AV VTI ratio: 0.37  AORTA Ao Root diam: 3.00 cm Ao Asc diam:  3.40 cm MITRAL VALVE              TRICUSPID VALVE MV Area VTI:  0.68 cm    TR Peak grad:   32.3 mmHg MV Peak grad: 19.7 mmHg   TR Vmax:        284.00 cm/s MV Mean grad: 11.0 mmHg MV Vmax:      2.22 m/s    SHUNTS MV Vmean:     154.0 cm/s  Systemic VTI:  0.16 m                           Systemic Diam: 1.50 cm Oneil Parchment MD Electronically signed by Oneil Parchment MD Signature Date/Time: 11/13/2024/4:38:42 PM    Final         Scheduled Meds:  sodium chloride    Intravenous Once   atorvastatin   20 mg Oral Daily   metoprolol  tartrate  12.5 mg Oral BID   Na Sulfate-K Sulfate-Mg Sulfate concentrate  0.5 kit Oral Once   Followed by   Na Sulfate-K Sulfate-Mg  Sulfate concentrate  0.5 kit Oral Once   pantoprazole  (PROTONIX ) IV  40 mg Intravenous Q12H   simethicone   240 mg Oral Once   Followed by   simethicone   240 mg Oral Once   sodium chloride  flush  3 mL Intravenous Q12H   Continuous Infusions:  sodium chloride  10 mL/hr at 11/15/24 0859   heparin  1,000 Units/hr (11/15/24 1204)     LOS: 3 days    Time spent: over 30 min     Meliton Monte, MD [facility]   To contact the attending provider between 7A-7P or the covering provider during after hours 7P-7A, please log into the web site [email or web address] and access using universal [place] password for that web site. If you do not have the password, please call the hospital operator.  11/15/2024, 2:52 PM    "

## 2024-11-16 ENCOUNTER — Encounter (HOSPITAL_COMMUNITY): Admission: EM | Disposition: A | Payer: Self-pay | Source: Home / Self Care | Attending: Family Medicine

## 2024-11-16 ENCOUNTER — Inpatient Hospital Stay (HOSPITAL_COMMUNITY): Admitting: Certified Registered"

## 2024-11-16 ENCOUNTER — Other Ambulatory Visit (HOSPITAL_COMMUNITY): Payer: Self-pay

## 2024-11-16 ENCOUNTER — Telehealth (HOSPITAL_COMMUNITY): Payer: Self-pay

## 2024-11-16 DIAGNOSIS — D649 Anemia, unspecified: Secondary | ICD-10-CM

## 2024-11-16 DIAGNOSIS — K2960 Other gastritis without bleeding: Secondary | ICD-10-CM | POA: Diagnosis not present

## 2024-11-16 DIAGNOSIS — K317 Polyp of stomach and duodenum: Secondary | ICD-10-CM

## 2024-11-16 DIAGNOSIS — I10 Essential (primary) hypertension: Secondary | ICD-10-CM

## 2024-11-16 DIAGNOSIS — D509 Iron deficiency anemia, unspecified: Secondary | ICD-10-CM | POA: Diagnosis not present

## 2024-11-16 DIAGNOSIS — K5730 Diverticulosis of large intestine without perforation or abscess without bleeding: Secondary | ICD-10-CM

## 2024-11-16 DIAGNOSIS — K222 Esophageal obstruction: Secondary | ICD-10-CM

## 2024-11-16 DIAGNOSIS — K449 Diaphragmatic hernia without obstruction or gangrene: Secondary | ICD-10-CM

## 2024-11-16 HISTORY — PX: COLONOSCOPY: SHX5424

## 2024-11-16 HISTORY — PX: ESOPHAGOGASTRODUODENOSCOPY: SHX5428

## 2024-11-16 LAB — TYPE AND SCREEN
ABO/RH(D): O POS
Antibody Screen: NEGATIVE
Unit division: 0
Unit division: 0
Unit division: 0
Unit division: 0

## 2024-11-16 LAB — BPAM RBC
Blood Product Expiration Date: 202601302359
Blood Product Expiration Date: 202601302359
Blood Product Expiration Date: 202601302359
Blood Product Expiration Date: 202601302359
ISSUE DATE / TIME: 202601041345
ISSUE DATE / TIME: 202601041642
ISSUE DATE / TIME: 202601050751
Unit Type and Rh: 5100
Unit Type and Rh: 5100
Unit Type and Rh: 5100
Unit Type and Rh: 5100

## 2024-11-16 LAB — CBC
HCT: 24.1 % — ABNORMAL LOW (ref 36.0–46.0)
Hemoglobin: 8 g/dL — ABNORMAL LOW (ref 12.0–15.0)
MCH: 28 pg (ref 26.0–34.0)
MCHC: 33.2 g/dL (ref 30.0–36.0)
MCV: 84.3 fL (ref 80.0–100.0)
Platelets: 239 K/uL (ref 150–400)
RBC: 2.86 MIL/uL — ABNORMAL LOW (ref 3.87–5.11)
RDW: 16.3 % — ABNORMAL HIGH (ref 11.5–15.5)
WBC: 8.3 K/uL (ref 4.0–10.5)
nRBC: 0.2 % (ref 0.0–0.2)

## 2024-11-16 LAB — PHOSPHORUS: Phosphorus: 1.9 mg/dL — ABNORMAL LOW (ref 2.5–4.6)

## 2024-11-16 LAB — PROTIME-INR
INR: 1.5 — ABNORMAL HIGH (ref 0.8–1.2)
Prothrombin Time: 19 s — ABNORMAL HIGH (ref 11.4–15.2)

## 2024-11-16 LAB — COMPREHENSIVE METABOLIC PANEL WITH GFR
ALT: 27 U/L (ref 0–44)
AST: 37 U/L (ref 15–41)
Albumin: 3.8 g/dL (ref 3.5–5.0)
Alkaline Phosphatase: 81 U/L (ref 38–126)
Anion gap: 10 (ref 5–15)
BUN: 13 mg/dL (ref 6–20)
CO2: 22 mmol/L (ref 22–32)
Calcium: 8.5 mg/dL — ABNORMAL LOW (ref 8.9–10.3)
Chloride: 106 mmol/L (ref 98–111)
Creatinine, Ser: 1.12 mg/dL — ABNORMAL HIGH (ref 0.44–1.00)
GFR, Estimated: 57 mL/min — ABNORMAL LOW
Glucose, Bld: 113 mg/dL — ABNORMAL HIGH (ref 70–99)
Potassium: 4.5 mmol/L (ref 3.5–5.1)
Sodium: 138 mmol/L (ref 135–145)
Total Bilirubin: 0.9 mg/dL (ref 0.0–1.2)
Total Protein: 6.2 g/dL — ABNORMAL LOW (ref 6.5–8.1)

## 2024-11-16 LAB — HEPARIN LEVEL (UNFRACTIONATED): Heparin Unfractionated: 0.4 [IU]/mL (ref 0.30–0.70)

## 2024-11-16 LAB — MAGNESIUM: Magnesium: 1.9 mg/dL (ref 1.7–2.4)

## 2024-11-16 MED ORDER — PHENYLEPHRINE HCL-NACL 20-0.9 MG/250ML-% IV SOLN
INTRAVENOUS | Status: DC | PRN
  Administered 2024-11-16: 35 ug/min via INTRAVENOUS

## 2024-11-16 MED ORDER — HEPARIN (PORCINE) 25000 UT/250ML-% IV SOLN
900.0000 [IU]/h | INTRAVENOUS | Status: DC
  Administered 2024-11-16: 1000 [IU]/h via INTRAVENOUS
  Administered 2024-11-17: 900 [IU]/h via INTRAVENOUS
  Administered 2024-11-19: 850 [IU]/h via INTRAVENOUS
  Filled 2024-11-16 (×3): qty 250

## 2024-11-16 MED ORDER — PROPOFOL 500 MG/50ML IV EMUL
INTRAVENOUS | Status: DC | PRN
  Administered 2024-11-16: 125 ug/kg/min via INTRAVENOUS

## 2024-11-16 NOTE — Transfer of Care (Signed)
 Immediate Anesthesia Transfer of Care Note  Patient: Natasha Mcknight  Procedure(s) Performed: EGD (ESOPHAGOGASTRODUODENOSCOPY) COLONOSCOPY  Patient Location: PACU  Anesthesia Type:MAC  Level of Consciousness: awake and alert   Airway & Oxygen Therapy: Patient Spontanous Breathing and Patient connected to face mask oxygen  Post-op Assessment: Report given to RN and Post -op Vital signs reviewed and stable  Post vital signs: Reviewed and stable  Last Vitals:  Vitals Value Taken Time  BP 103/54 11/16/24 09:22  Temp 36.6 C 11/16/24 09:22  Pulse 85 11/16/24 09:30  Resp 15 11/16/24 09:30  SpO2 100 % 11/16/24 09:30  Vitals shown include unfiled device data.  Last Pain:  Vitals:   11/16/24 0922  TempSrc:   PainSc: 0-No pain         Complications: No notable events documented.

## 2024-11-16 NOTE — Progress Notes (Signed)
 ANTICOAGULATION CONSULT NOTE  Pharmacy Consult for Heparin  Indication: mechanical valve  Allergies[1]  Patient Measurements: Height: 5' 4 (162.6 cm) Weight: 65.8 kg (145 lb) IBW/kg (Calculated) : 54.7 Heparin  dosing weight: 65.8 kg  Vital Signs: Temp: 97.7 F (36.5 C) (01/08 1154) Temp Source: Oral (01/08 1154) BP: 118/63 (01/08 1154) Pulse Rate: 94 (01/08 1154)  Labs: Recent Labs    11/13/24 1308 11/14/24 0216 11/15/24 0157 11/15/24 0859 11/15/24 1158 11/16/24 0225  HGB 8.8*  --   --   --  8.4* 8.0*  HCT 27.1*  --   --   --  24.7* 24.1*  PLT 223  --   --   --   --  239  LABPROT  --  32.2* 24.4*  --   --  19.0*  INR  --  3.0* 2.1*  --   --  1.5*  HEPARINUNFRC  --   --   --  0.47  --   --   CREATININE  --   --   --   --  1.11* 1.12*    Estimated Creatinine Clearance: 51.1 mL/min (A) (by C-G formula based on SCr of 1.12 mg/dL (H)).   Assessment: 60 yoF admitted with symptomatic anemia and suspected GIB. 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 Heparin  started this am with INR < 2.5.  Today's INR down to 1.5.  Heparin  was turned off this AM for GI scope.  Per discussion with Drs. Stacia Mau and [place] will resume IV Heparin  drip now, and likely Coumadin  tomorrow 1/9.  Goal of Therapy:  INR Goal 2.5-3.5 HL 0.3-0.5 Monitor platelets by anticoagulation protocol: Yes  Plan:  Resume IV heparin  at 1000 units/hr.  Will target lower end of goal range. Check heparin  level in 6 hrs. Daily heparin  level, INR and CBC.  Harlene Barlow, Berdine [employer], BCCP Clinical Pharmacist  11/16/2024 12:26 PM   [facility] pharmacy phone numbers are listed on [email or web address]        [1]  Allergies Allergen Reactions   Ace Inhibitors Cough and Shortness Of Breath   Vancomycin Anaphylaxis and Other (See Comments)    Other Reaction: severe hypotension   Iodine    Latex

## 2024-11-16 NOTE — Telephone Encounter (Signed)
 Pharmacy Patient Advocate Encounter  Insurance verification completed.    The patient is insured through [facility]. Patient has [employer], may use a copay card, and/or apply for patient assistance if available.    Ran test claim for Lovenox  60mg /0.82ml and the current 5 day co-pay is $17.26.   This test claim was processed through [facility]- copay amounts may vary at other pharmacies due to pharmacy/plan contracts, or as the patient moves through the different stages of their insurance plan.

## 2024-11-16 NOTE — Op Note (Addendum)
 [facility] Patient Name: Natasha Mcknight Procedure Date : 11/16/2024 MRN: [id number] Attending MD: Glendia BRAVO. Stacia , MD, [id number] Date of Birth: [birth year] CSN: [id number] Age: 59 Admit Type: Inpatient Procedure:                Upper GI endoscopy Indications:              Iron  deficiency anemia Providers:                Glendia E. Stacia, MD, Heather Ng, RN, Corene Southgate, Technician Referring MD:              Medicines:                Monitored Anesthesia Care Complications:            No immediate complications. Estimated Blood Loss:     Estimated blood loss was minimal. Procedure:                Pre-Anesthesia Assessment:                           - Prior to the procedure, a History and Physical                            was performed, and patient medications and                            allergies were reviewed. The patient's tolerance of                            previous anesthesia was also reviewed. The risks                            and benefits of the procedure and the sedation                            options and risks were discussed with the patient.                            All questions were answered, and informed consent                            was obtained. Prior Anticoagulants: The patient has                            taken Coumadin  (warfarin), last dose was 3 days                            prior to procedure and was on a heparin  drip that                            was stopped 5 hours prior to the procedure start.  ASA Grade Assessment: III - A patient with severe                            systemic disease. After reviewing the risks and                            benefits, the patient was deemed in satisfactory                            condition to undergo the procedure.                           After obtaining informed consent, the endoscope was                             passed under direct vision. Throughout the                            procedure, the patient's blood pressure, pulse, and                            oxygen saturations were monitored continuously. The                            [id number]-[id number] ([id number]) Olympus endoscope was introduced                            through the mouth, and advanced to the second part                            of duodenum. The upper GI endoscopy was somewhat                            difficult due to poor endoscopic visualization                            secondary difficulty maintaining insufflation and                            respiratory variation. Scope In: Scope Out: Findings:      The examined portions of the nasopharynx, oropharynx and larynx were       normal.      A low-grade of narrowing Schatzki ring was found at the gastroesophageal       junction.      The exam of the esophagus was otherwise normal.      A 4 cm hiatal hernia was present.      Numerous (too numerous to count) pedunculated and sessile polyps with       the smallest being around 3 mm and the largest about 25 mm, with over a       dozen larger than 10 mm with no bleeding and no stigmata of recent       bleeding were found in the gastric fundus and in the gastric body. A few       of these polyps  were erythematous, but there was no blood or clot       present. Sixteen of the largest polyps were removed with either a hot       snare or cold snare. Resection was complete, but not all of the polyps       were retrieved. Multiple passes with the Madelyn net were used to retrieve       the majority of the resected polyps as they were too large to be       suctioned through the suction channed. Estimated blood loss was minimal.      The exam of the stomach was otherwise normal.      Biopsies were taken with a cold forceps in the gastric antrum for       Helicobacter pylori testing. Estimated blood loss was minimal.      The examined  duodenum was normal. Impression:               - The examined portions of the nasopharynx,                            oropharynx and larynx were normal.                           - Low-grade of narrowing Schatzki ring.                           - 4 cm hiatal hernia.                           - Multiple (dozens) gastric polyps. Sixteen of the                            largest of these were removed with partial                            retrieval. These polyps appeared consistent with                            fundic gland polyps. The patient's iron  deficiency                            deficiency is most likely secondary to chronic                            intermittent oozing from these gastric polyps.                           - Normal examined duodenum.                           - Biopsies were taken with a cold forceps for                            Helicobacter pylori testing. Moderate Sedation:      N/A Recommendation:           - Return patient to hospital ward for possible  discharge same day.                           - Resume regular diet.                           - Use Protonix  (pantoprazole ) 40 mg PO BID for 8                            weeks, then once daily indefinitely.                           - Use sucralfate  tablets 1 gram PO QID for 2 weeks                            to facilitate healing of numerous polypectomy sites.                           - Await pathology results.                           - Repeat upper endoscopy in 2-6 months for further                            reduce polyp burden. This should be done in the                            outpatient hospital setting and will need to                            coordinate with coumadin  clinic regarding                            outpatient lovenox  bridge for outpatient EGD.                           - Post-Procedure Resumption of Anticoagulants:                            Restart  warfarin tomorrow maintain INR in the range                            of 2.5 - 3.5.                           - Repeat CBC in 2 weeks.                           - Recommend outpatient oral iron  supplementation Procedure Code(s):        --- Professional ---                           43251, Esophagogastroduodenoscopy, flexible,  transoral; with removal of tumor(s), polyp(s), or                            other lesion(s) by snare technique                           43239, 59, Esophagogastroduodenoscopy, flexible,                            transoral; with biopsy, single or multiple Diagnosis Code(s):        --- Professional ---                           K22.2, Esophageal obstruction                           K44.9, Diaphragmatic hernia without obstruction or                            gangrene                           K31.7, Polyp of stomach and duodenum                           D50.9, Iron  deficiency anemia, unspecified CPT copyright 2022 American Medical Association. All rights reserved. The codes documented in this report are preliminary and upon coder review may  be revised to meet current compliance requirements. Yulian Gosney E. Stacia, MD 11/16/2024 9:39:17 AM This report has been signed electronically. Number of Addenda: 0

## 2024-11-16 NOTE — Progress Notes (Signed)
 ANTICOAGULATION CONSULT NOTE  Pharmacy Consult for Heparin  Indication: mechanical valve  Allergies[1]  Patient Measurements: Height: 5' 4 (162.6 cm) Weight: 65.8 kg (145 lb) IBW/kg (Calculated) : 54.7 Heparin  dosing weight: 65.8 kg  Vital Signs: Temp: 97.7 F (36.5 C) (01/08 2055) Temp Source: Oral (01/08 2055) BP: 121/53 (01/08 2055) Pulse Rate: 99 (01/08 2055)  Labs: Recent Labs    11/14/24 0216 11/15/24 0157 11/15/24 0859 11/15/24 1158 11/16/24 0225 11/16/24 2050  HGB  --   --   --  8.4* 8.0*  --   HCT  --   --   --  24.7* 24.1*  --   PLT  --   --   --   --  239  --   LABPROT 32.2* 24.4*  --   --  19.0*  --   INR 3.0* 2.1*  --   --  1.5*  --   HEPARINUNFRC  --   --  0.47  --   --  0.40  CREATININE  --   --   --  1.11* 1.12*  --     Estimated Creatinine Clearance: 51.1 mL/min (A) (by C-G formula based on SCr of 1.12 mg/dL (H)).   Assessment: 62 yoF admitted with symptomatic anemia and suspected GIB. 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 Heparin  started this am with INR < 2.5.  Today's INR down to 1.5.  Heparin  was turned off this AM for GI scope.  Per discussion with Drs. Stacia Mau and [place] will resume IV Heparin  drip now, and likely Coumadin  tomorrow 1/9.  1/8 PM: Heparin  level therapeutic at 0.40. Continue current rate.  Goal of Therapy:  INR Goal 2.5-3.5 HL 0.3-0.5 Monitor platelets by anticoagulation protocol: Yes  Plan:  Continue IV heparin  at 1000 units/hr.  Will target lower end of goal range. Check heparin  level with AM labs. Daily heparin  level, INR and CBC.  Larraine Brazier, PharmD Clinical Pharmacist 11/16/2024  10:50 PM **Pharmacist phone directory can now be found on [email or web address] ([email or web address]).  Listed under [facility].           [1]  Allergies Allergen Reactions   Ace Inhibitors Cough and Shortness Of Breath   Vancomycin Anaphylaxis and Other (See Comments)    Other  Reaction: severe hypotension   Iodine    Latex

## 2024-11-16 NOTE — Anesthesia Preprocedure Evaluation (Signed)
"                                    Anesthesia Evaluation  Patient identified by MRN, date of birth, ID band Patient awake    Reviewed: Allergy & Precautions, NPO status , Patient's Chart, lab work & pertinent test results  History of Anesthesia Complications Negative for: history of anesthetic complications  Airway Mallampati: II  TM Distance: >3 FB Neck ROM: Full    Dental no notable dental hx. (+) Teeth Intact   Pulmonary neg pulmonary ROS, neg sleep apnea, neg COPD, Patient abstained from smoking.Not current smoker   Pulmonary exam normal breath sounds clear to auscultation       Cardiovascular Exercise Tolerance: Good METShypertension, Pt. on medications (-) CAD and (-) Past MI (-) dysrhythmias  Rhythm:Regular Rate:Normal - Systolic murmurs    Neuro/Psych negative neurological ROS  negative psych ROS   GI/Hepatic ,GERD  ,,(+)     (-) substance abuse    Endo/Other  neg diabetes  GLP1ra taken 5 days ago. Denies N/V .  Renal/GU negative Renal ROS     Musculoskeletal   Abdominal   Peds  Hematology  (+) Blood dyscrasia, anemia   Anesthesia Other Findings Past Medical History: No date: Anemia No date: Clotting disorder No date: Endometriosis No date: Fibroids No date: GERD (gastroesophageal reflux disease) No date: Murmur, cardiac  Reproductive/Obstetrics                              Anesthesia Physical Anesthesia Plan  ASA: 2  Anesthesia Plan: MAC   Post-op Pain Management: Minimal or no pain anticipated   Induction: Intravenous  PONV Risk Score and Plan: 2 and Propofol  infusion, TIVA and Ondansetron   Airway Management Planned: Nasal Cannula  Additional Equipment: None  Intra-op Plan:   Post-operative Plan:   Informed Consent: I have reviewed the patients History and Physical, chart, labs and discussed the procedure including the risks, benefits and alternatives for the proposed anesthesia with the  patient or authorized representative who has indicated his/her understanding and acceptance.     Dental advisory given  Plan Discussed with: CRNA and Surgeon  Anesthesia Plan Comments: (Patient currently takes a GLP-1 agonist medication. The latest guidelines from American Society of Anesthesiologists no longer make a blanket recommendation for cessation of weekly injectable medication for one week prior to elective procedure, and of daily medication for one day prior to elective procedure, but rather suggest patient factors and clinical judgement should be taken into account. If these guidelines have not been adhered to, a risk/benefit discussion should be had with patient, and clinical judgement used. I spoke with the patient and proceduralist about the r/b/a of proceeding with elective surgeries in these instances, and both understand and accept the risks of proceeding. Patient denies N/V and is s/p a two day clear liquid diet and colon prep, lowering her risk.  Discussed risks of anesthesia with patient, including possibility of difficulty with spontaneous ventilation under anesthesia necessitating airway intervention, PONV, and rare risks such as cardiac or respiratory or neurological events, and allergic reactions. Discussed the role of CRNA in patient's perioperative care. Patient understands.)        Anesthesia Quick Evaluation  "

## 2024-11-16 NOTE — Anesthesia Postprocedure Evaluation (Signed)
"   Anesthesia Post Note  Patient: Natasha Mcknight  Procedure(s) Performed: EGD (ESOPHAGOGASTRODUODENOSCOPY) COLONOSCOPY     Patient location during evaluation: Endoscopy Anesthesia Type: MAC Level of consciousness: awake and alert Pain management: pain level controlled Vital Signs Assessment: post-procedure vital signs reviewed and stable Respiratory status: spontaneous breathing, nonlabored ventilation, respiratory function stable and patient connected to nasal cannula oxygen Cardiovascular status: blood pressure returned to baseline and stable Postop Assessment: no apparent nausea or vomiting Anesthetic complications: no   No notable events documented.  Last Vitals:  Vitals:   11/16/24 0930 11/16/24 0945  BP: 118/88 102/64  Pulse: 86   Resp: 16 20  Temp:  36.6 C  SpO2: 100% 100%    Last Pain:  Vitals:   11/16/24 0945  TempSrc:   PainSc: 0-No pain                 Rome Ade      "

## 2024-11-16 NOTE — Op Note (Signed)
 [facility] Patient Name: Natasha Mcknight Procedure Date : 11/16/2024 MRN: [id number] Attending MD: Glendia BRAVO. Stacia , MD, [id number] Date of Birth: Oct 06, 1966 CSN: [id number] Age: 59 Admit Type: Inpatient Procedure:                Colonoscopy Indications:              Iron  deficiency anemia Providers:                Glendia E. Stacia, MD, Heather Ng, RN, Corene Southgate, Technician Referring MD:              Medicines:                Monitored Anesthesia Care Complications:            No immediate complications. Estimated Blood Loss:     Estimated blood loss was minimal. Procedure:                Pre-Anesthesia Assessment:                           - Prior to the procedure, a History and Physical                            was performed, and patient medications and                            allergies were reviewed. The patient's tolerance of                            previous anesthesia was also reviewed. The risks                            and benefits of the procedure and the sedation                            options and risks were discussed with the patient.                            All questions were answered, and informed consent                            was obtained. Prior Anticoagulants: The patient has                            taken Coumadin  (warfarin), last dose was 3 days                            prior to procedure. ASA Grade Assessment: III - A                            patient with severe systemic disease. After  reviewing the risks and benefits, the patient was                            deemed in satisfactory condition to undergo the                            procedure.                           After obtaining informed consent, the colonoscope                            was passed under direct vision. Throughout the                            procedure, the patient's blood  pressure, pulse, and                            oxygen saturations were monitored continuously. The                            CF-HQ190L ([id number]) Olympus colonoscopy was                            introduced through the anus and advanced to the the                            terminal ileum, with identification of the                            appendiceal orifice and IC valve. The colonoscopy                            was performed without difficulty. The patient                            tolerated the procedure well. The quality of the                            bowel preparation was excellent. The terminal                            ileum, ileocecal valve, appendiceal orifice, and                            rectum were photographed. The bowel preparation                            used was SUPREP via split dose instruction. Scope In: 9:04:45 AM Scope Out: 9:15:33 AM Scope Withdrawal Time: 0 hours 7 minutes 48 seconds  Total Procedure Duration: 0 hours 10 minutes 48 seconds  Findings:      The perianal and digital rectal examinations were normal. Pertinent       negatives include normal sphincter tone and no palpable rectal lesions.  A single medium-mouthed diverticulum was found in the ascending colon.      The exam was otherwise normal throughout the examined colon.      The terminal ileum appeared normal.      The retroflexed view of the distal rectum and anal verge was normal and       showed no anal or rectal abnormalities. Impression:               - Diverticulosis in the ascending colon.                           - The examined portion of the ileum was normal.                           - The distal rectum and anal verge are normal on                            retroflexion view.                           - No specimens collected. Moderate Sedation:      N/A Recommendation:           - Return patient to hospital ward for possible                            discharge same  day.                           - Resume regular diet.                           - Post-Procedure Resumption of Anticoagulants:                            Restart warfarin tomorrow to maintain INR in the                            range of 2.5 - 3.5. Defer to coumadin                             clinic/pharmacy for dosing.                           - Repeat colonoscopy in 10 years for screening                            purposes. Procedure Code(s):        --- Professional ---                           [phone], Colonoscopy, flexible; diagnostic, including                            collection of specimen(s) by brushing or washing,  when performed (separate procedure) Diagnosis Code(s):        --- Professional ---                           D50.9, Iron  deficiency anemia, unspecified                           K57.30, Diverticulosis of large intestine without                            perforation or abscess without bleeding CPT copyright 2022 American Medical Association. All rights reserved. The codes documented in this report are preliminary and upon coder review may  be revised to meet current compliance requirements. Roald Lukacs E. Stacia, MD 11/16/2024 9:46:29 AM This report has been signed electronically. Number of Addenda: 0

## 2024-11-16 NOTE — Progress Notes (Signed)
" ° ° °  Spoke with Dr. Verlin regarding anticoagulation and IV heparin . She is high risk for valve thrombosis with mechanical MVR/AVR as she has been off coumadin  for several days. Would recommend resuming IV heparin  for bridging with coumadin  and monitoring for recurrent bleeding. Will follow up tomorrow to determine if/when timing appropriate for Lovenox  bridging for DC home.   Bonney Manuelita Rummer, NP-C 11/16/2024, 11:29 AM Pager: [phone]  "

## 2024-11-16 NOTE — Interval H&P Note (Signed)
 History and Physical Interval Note:  11/16/2024 7:37 AM  Natasha Mcknight  has presented today for surgery, with the diagnosis of Iron  deficiency anemia.  The various methods of treatment have been discussed with the patient and family. After consideration of risks, benefits and other options for treatment, the patient has consented to  Procedures: EGD (ESOPHAGOGASTRODUODENOSCOPY) (N/A) COLONOSCOPY (N/A) as a surgical intervention.  The patient's history has been reviewed, patient examined, no change in status, stable for surgery.  I have reviewed the patient's chart and labs.  Questions were answered to the patient's satisfaction.    INR 1.5 this morning   [name]

## 2024-11-17 ENCOUNTER — Encounter (HOSPITAL_COMMUNITY): Payer: Self-pay | Admitting: Gastroenterology

## 2024-11-17 ENCOUNTER — Telehealth (HOSPITAL_COMMUNITY): Payer: Self-pay | Admitting: Pharmacy Technician

## 2024-11-17 ENCOUNTER — Encounter (HOSPITAL_COMMUNITY)
Admission: RE | Admit: 2024-11-17 | Discharge: 2024-11-17 | Disposition: A | Source: Ambulatory Visit | Attending: Cardiology

## 2024-11-17 ENCOUNTER — Telehealth (HOSPITAL_COMMUNITY): Payer: Self-pay | Admitting: Pharmacist

## 2024-11-17 DIAGNOSIS — Z9889 Other specified postprocedural states: Secondary | ICD-10-CM | POA: Diagnosis not present

## 2024-11-17 DIAGNOSIS — Z7901 Long term (current) use of anticoagulants: Secondary | ICD-10-CM | POA: Diagnosis not present

## 2024-11-17 DIAGNOSIS — D649 Anemia, unspecified: Secondary | ICD-10-CM | POA: Diagnosis not present

## 2024-11-17 DIAGNOSIS — Z952 Presence of prosthetic heart valve: Secondary | ICD-10-CM | POA: Diagnosis not present

## 2024-11-17 LAB — PREPARE RBC (CROSSMATCH)

## 2024-11-17 LAB — CBC
HCT: 22.3 % — ABNORMAL LOW (ref 36.0–46.0)
Hemoglobin: 7.3 g/dL — ABNORMAL LOW (ref 12.0–15.0)
MCH: 27.9 pg (ref 26.0–34.0)
MCHC: 32.7 g/dL (ref 30.0–36.0)
MCV: 85.1 fL (ref 80.0–100.0)
Platelets: 202 K/uL (ref 150–400)
RBC: 2.62 MIL/uL — ABNORMAL LOW (ref 3.87–5.11)
RDW: 16.1 % — ABNORMAL HIGH (ref 11.5–15.5)
WBC: 7.7 K/uL (ref 4.0–10.5)
nRBC: 0 % (ref 0.0–0.2)

## 2024-11-17 LAB — BASIC METABOLIC PANEL WITH GFR
Anion gap: 8 (ref 5–15)
BUN: 14 mg/dL (ref 6–20)
CO2: 23 mmol/L (ref 22–32)
Calcium: 8.2 mg/dL — ABNORMAL LOW (ref 8.9–10.3)
Chloride: 105 mmol/L (ref 98–111)
Creatinine, Ser: 1.06 mg/dL — ABNORMAL HIGH (ref 0.44–1.00)
GFR, Estimated: >60 mL/min
Glucose, Bld: 141 mg/dL — ABNORMAL HIGH (ref 70–99)
Potassium: 4.1 mmol/L (ref 3.5–5.1)
Sodium: 136 mmol/L (ref 135–145)

## 2024-11-17 LAB — PROTIME-INR
INR: 1.3 — ABNORMAL HIGH (ref 0.8–1.2)
Prothrombin Time: 17.2 s — ABNORMAL HIGH (ref 11.4–15.2)

## 2024-11-17 LAB — HEMOGLOBIN AND HEMATOCRIT, BLOOD
HCT: 22.2 % — ABNORMAL LOW (ref 36.0–46.0)
Hemoglobin: 7.2 g/dL — ABNORMAL LOW (ref 12.0–15.0)

## 2024-11-17 LAB — HEPARIN LEVEL (UNFRACTIONATED): Heparin Unfractionated: 0.58 [IU]/mL (ref 0.30–0.70)

## 2024-11-17 MED ORDER — IRON SUCROSE 200 MG IVPB - SIMPLE MED
200.0000 mg | Freq: Once | Status: AC
  Administered 2024-11-17: 200 mg via INTRAVENOUS
  Filled 2024-11-17: qty 200

## 2024-11-17 MED ORDER — SODIUM CHLORIDE 0.9% IV SOLUTION: Freq: Once | INTRAVENOUS | Status: DC

## 2024-11-17 NOTE — Progress Notes (Addendum)
 " PROGRESS NOTE     Natasha Mcknight  FMW:[id number] DOB: [birth year] DOA: 11/12/2024 PCP: Vernon Velna SAUNDERS, MD  Chief Complaint  Patient presents with   Dizziness   Emesis    Brief Narrative:   59 year old female with HTN, GERD, status post mechanical and aortic valve replacement in September 2025 at [facility], comes into the hospital with weakness, dizziness. This has been going on roughly acutely over the last 3 to 4 days. She has been nauseous during that time. She denies any chest pains. She denies any frank bleeding in her stool but did notice that her stools are darker than normal. In the ED she was found to have Brondon Wann hemoglobin of 4.4, fecal occult was positive. She was admitted to the hospital, and GI consulted. Of note, EDP reached out to [facility] cardiothoracic surgery who said that patient could be admitted here and get it 2D echo followed by TEE, and if any issues with the valve to reach back for potential transfer.   TEE deferred here after discussion with cardiology given lack of clinically significant hemolysis.   Now pending GI eval, upper and lower endoscopy planned for 1/8.  Assessment & Plan:   Principal Problem:   Symptomatic anemia Active Problems:   GERD   Essential hypertension with goal blood pressure less than 140/90   S/P mitral valve repair   H/O mitral valve replacement with mechanical valve   S/P AVR (aortic valve replacement)   Acquired hemolytic anemia (HCC)   Mitral valve disease   Chronic anticoagulation   Gastrointestinal hemorrhage   Myocardiopathy (HCC)  Symptomatic anemia, positive FOBT Iron  Deficiency patient presented to the hospital with Manus Weedman hemoglobin of 4.4.   Haptoglobin normal, bilirubin wnl, LDH is elevated - not suggestive of hemolysis  + Gastroenterology consulted, appreciate input.  Planning for upper and lower endoscopy 1/8. - s/p EGD 1/8 notable for dozens of gastric polyps, 16 removed with partial retrieval, polyps appeared c/w  fundic gland polyps - suspect iron  def anemia likely due to chronic intermittent oozing.  Follow pending biopsy.  - colonoscopy 1/8 with diverticulosis in ascending colon - s/p 3 units pRBC - Hb down to 7.2 today, will transfuse 1 unit pRBC - will give IV iron  and refer for outpatient IV iron  as well - significant iron  def anemia, ferritin 11 with low iron .  Normal B12, folate. - continue to monitor H/H - maintain active type and screen   History of mitral and aortic mechanical valves -replaced at [facility] in September 2024.  She was placed on warfarin since then.  She was not on anticoagulation prior to that. - INR is 1.3 today, holding on resumption of warfarin with drop in Hb  - continue heparin  gtt - appreciate cardiology recommendations   Essential hypertension-continue home metoprolol , blood pressure is stable   CKD 3A Creatinine appears to be lower than recent baseline   Type 2 diabetes mellitus BG's ok, follow A1c here - not currently receiving any insulin   Chronic diastolic CHF  Echo with EF 55-60%, indeterminate diastolic parameters (notably grade III diastolic dysfunction on echo from 02/2024) Lasix currently on hold Strict I/O, daily weights  Hyperlipidemia lipitor    DVT prophylaxis: heparin  gtt Code Status: full Family Communication: none Disposition:   Status is: Inpatient Remains inpatient appropriate because: need for continued inpatient care   Consultants:  Cardiology gastroenterology  Procedures:  Echo IMPRESSIONS     1. Left ventricular ejection fraction, by estimation, is 55 to 60%. The  left ventricle has normal function. The left ventricle has no regional  wall motion abnormalities. Left ventricular diastolic parameters are  indeterminate.   2. Right ventricular systolic function is normal. The right ventricular  size is mildly enlarged. There is normal pulmonary artery systolic  pressure.   3. Left atrial size was moderately dilated.   4.  The mitral valve has been repaired/replaced. No evidence of mitral  valve regurgitation. No evidence of mitral stenosis. There is Whittley Carandang 23 mm  Regent mechanical valve present in the mitral position. Echo findings are  consistent with normal structure and  function of the mitral valve prosthesis.   5. Tricuspid valve regurgitation is moderate.   6. The aortic valve has been repaired/replaced. Aortic valve  regurgitation is not visualized. No aortic stenosis is present. There is Aiya Keach  19 mm Regent mechanical valve present in the aortic position. Procedure  Date: 9/25. Echo findings are consistent with   normal structure and function of the aortic valve prosthesis.   7. The inferior vena cava is normal in size with greater than 50%  respiratory variability, suggesting right atrial pressure of 3 mmHg.   8. Agitated saline contrast bubble study was negative, with no evidence  of any interatrial shunt.   Conclusion(s)/Recommendation(s): No intracardiac source of embolism  detected on this transthoracic study. Consider Rasheka Denard transesophageal  echocardiogram to exclude cardiac source of embolism if clinically  indicated.   Antimicrobials:  Anti-infectives (From admission, onward)    None       Subjective: No complaints today Asking about anticoagulation plan at discharge  Objective: Vitals:   11/17/24 0357 11/17/24 0500 11/17/24 0733 11/17/24 1216  BP: 111/63  124/64 126/63  Pulse: 91  93 91  Resp: 17  18 19   Temp: 98.3 F (36.8 C)  98 F (36.7 C) 98.1 F (36.7 C)  TempSrc: Oral  Oral Oral  SpO2: 100%  100% 100%  Weight:  69 kg    Height:        Intake/Output Summary (Last 24 hours) at 11/17/2024 1403 Last data filed at 11/16/2024 1607 Gross per 24 hour  Intake 37.48 ml  Output --  Net 37.48 ml   Filed Weights   11/12/24 0933 11/17/24 0500  Weight: 65.8 kg 69 kg    Examination:  General: No acute distress. Cardiovascular: RRR Lungs: unlabored Abdomen: Soft, nontender,  nondistended  Neurological: Alert and oriented 3. Moves all extremities 4 with equal strength. Cranial nerves II through XII grossly intact. Extremities: No clubbing or cyanosis. No edema.   Data Reviewed: I have personally reviewed following labs and imaging studies  CBC: Recent Labs  Lab 11/12/24 0945 11/12/24 1143 11/13/24 0431 11/13/24 0650 11/13/24 1308 11/15/24 1158 11/16/24 0225 11/17/24 0209 11/17/24 1023  WBC 7.4   < > 8.9 9.0 8.5  --  8.3 7.7  --   NEUTROABS 5.8  --   --  6.2  --   --   --   --   --   HGB 4.4*   < > 7.2* 7.0* 8.8* 8.4* 8.0* 7.3* 7.2*  HCT 15.2*   < > 22.1* 21.7* 27.1* 24.7* 24.1* 22.3* 22.2*  MCV 83.5   < > 83.7 84.4 86.6  --  84.3 85.1  --   PLT 250   < > 234 223 223  --  239 202  --    < > = values in this interval not displayed.    Basic Metabolic Panel: Recent Labs  Lab  11/12/24 0945 11/13/24 0431 11/15/24 1158 11/16/24 0225 11/17/24 0209  NA 141 139 136 138 136  K 3.5 3.9 4.3 4.5 4.1  CL 104 105 105 106 105  CO2 24 25 24 22 23   GLUCOSE 195* 107* 98 113* 141*  BUN 26* 23* 18 13 14   CREATININE 1.18* 1.14* 1.11* 1.12* 1.06*  CALCIUM  9.8 8.6* 8.5* 8.5* 8.2*  MG  --   --   --  1.9  --   PHOS  --   --   --  1.9*  --     GFR: Estimated Creatinine Clearance: 55.2 mL/min (Coda Mathey) (by C-G formula based on SCr of 1.06 mg/dL (H)).  Liver Function Tests: Recent Labs  Lab 11/12/24 0945 11/13/24 0431 11/16/24 0225  AST 23 20 37  ALT 21 17 27   ALKPHOS 96 68 81  BILITOT 0.4 0.7 0.9  PROT 6.6 5.9* 6.2*  ALBUMIN 4.1 3.8 3.8    CBG: No results for input(s): GLUCAP in the last 168 hours.   No results found for this or any previous visit (from the past 240 hours).       Radiology Studies: No results found.       Scheduled Meds:  sodium chloride    Intravenous Once   atorvastatin   20 mg Oral Daily   metoprolol  tartrate  12.5 mg Oral BID   pantoprazole  (PROTONIX ) IV  40 mg Intravenous Q12H   sodium chloride  flush  3 mL  Intravenous Q12H   Continuous Infusions:  heparin  900 Units/hr (11/17/24 0830)     LOS: 5 days    Time spent: over 30 min     Meliton Monte, MD [facility]   To contact the attending provider between 7A-7P or the covering provider during after hours 7P-7A, please log into the web site [email or web address] and access using universal [place] password for that web site. If you do not have the password, please call the hospital operator.  11/17/2024, 2:03 PM    "

## 2024-11-17 NOTE — Progress Notes (Signed)
 " PROGRESS NOTE (late entry 1/9)    Natasha Mcknight  FMW:[id number] DOB: [birth year] DOA: 11/12/2024 PCP: Natasha Velna SAUNDERS, MD  Chief Complaint  Patient presents with   Dizziness   Emesis    Brief Narrative:   59 year old female with HTN, GERD, status post mechanical and aortic valve replacement in September 2025 at [facility], comes into the hospital with weakness, dizziness. This has been going on roughly acutely over the last 3 to 4 days. She has been nauseous during that time. She denies any chest pains. She denies any frank bleeding in her stool but did notice that her stools are darker than normal. In the ED she was found to have Natasha Mcknight hemoglobin of 4.4, fecal occult was positive. She was admitted to the hospital, and GI consulted. Of note, EDP reached out to [facility] cardiothoracic surgery who said that patient could be admitted here and get it 2D echo followed by TEE, and if any issues with the valve to reach back for potential transfer.   TEE deferred here after discussion with cardiology given lack of clinically significant hemolysis.   Now pending GI eval, upper and lower endoscopy planned for 1/8.  Assessment & Plan:   Principal Problem:   Symptomatic anemia Active Problems:   GERD   Essential hypertension with goal blood pressure less than 140/90   S/P mitral valve repair   H/O mitral valve replacement with mechanical valve   S/P AVR (aortic valve replacement)   Acquired hemolytic anemia (HCC)   Mitral valve disease   Chronic anticoagulation   Gastrointestinal hemorrhage   Myocardiopathy (HCC)  Symptomatic anemia, positive FOBT Iron  Deficiency patient presented to the hospital with Natasha Mcknight hemoglobin of 4.4.   Haptoglobin normal, bilirubin wnl, LDH is elevated - not suggestive of hemolysis  + Gastroenterology consulted, appreciate input.  Planning for upper and lower endoscopy 1/8. - s/p EGD 1/8 notable for dozens of gastric polyps, 16 removed with partial retrieval,  polyps appeared c/w fundic gland polyps - suspect iron  def anemia likely due to chronic intermittent oozing.  Follow pending biopsy.  - colonoscopy 1/8 with diverticulosis in ascending colon - s/p 3 units pRBC - significant iron  def anemia, ferritin 11 with low iron .  Would benefit from IV iron  prior to discharge.  Normal B12, folate. - continue to monitor H/H - maintain active type and screen   History of mitral and aortic mechanical valves -replaced at [facility] in September 2024.  She was placed on warfarin since then.  She was not on anticoagulation prior to that. - INR is 1.5 on 1/8 - resume heparin  gtt after procedure - appreciate cardiology assistance, last note 1/6   Essential hypertension-continue home metoprolol , blood pressure is stable   CKD 3A Creatinine appears to be lower than recent baseline   Type 2 diabetes mellitus BG's ok, follow A1c here - not currently receiving any insulin   Chronic diastolic CHF  Echo with EF 55-60%, indeterminate diastolic parameters (notably grade III diastolic dysfunction on echo from 02/2024) Lasix currently on hold Strict I/O, daily weights  Hyperlipidemia lipitor    DVT prophylaxis: heparin  gtt Code Status: full Family Communication: none Disposition:   Status is: Inpatient Remains inpatient appropriate because: need for continued inpatient care   Consultants:  Cardiology gastroenterology  Procedures:  Echo IMPRESSIONS     1. Left ventricular ejection fraction, by estimation, is 55 to 60%. The  left ventricle has normal function. The left ventricle has no regional  wall motion abnormalities. Left  ventricular diastolic parameters are  indeterminate.   2. Right ventricular systolic function is normal. The right ventricular  size is mildly enlarged. There is normal pulmonary artery systolic  pressure.   3. Left atrial size was moderately dilated.   4. The mitral valve has been repaired/replaced. No evidence of mitral  valve  regurgitation. No evidence of mitral stenosis. There is Natasha Mcknight 23 mm  Regent mechanical valve present in the mitral position. Echo findings are  consistent with normal structure and  function of the mitral valve prosthesis.   5. Tricuspid valve regurgitation is moderate.   6. The aortic valve has been repaired/replaced. Aortic valve  regurgitation is not visualized. No aortic stenosis is present. There is Paysley Mcknight  19 mm Regent mechanical valve present in the aortic position. Procedure  Date: 9/25. Echo findings are consistent with   normal structure and function of the aortic valve prosthesis.   7. The inferior vena cava is normal in size with greater than 50%  respiratory variability, suggesting right atrial pressure of 3 mmHg.   8. Agitated saline contrast bubble study was negative, with no evidence  of any interatrial shunt.   Conclusion(s)/Recommendation(s): No intracardiac source of embolism  detected on this transthoracic study. Consider Natasha Mcknight transesophageal  echocardiogram to exclude cardiac source of embolism if clinically  indicated.   Antimicrobials:  Anti-infectives (From admission, onward)    None       Subjective: Lots of questions after her procedure, but no complaints  Objective: Vitals:   11/17/24 0357 11/17/24 0500 11/17/24 0733 11/17/24 1216  BP: 111/63  124/64 126/63  Pulse: 91  93 91  Resp: 17  18 19   Temp: 98.3 F (36.8 C)  98 F (36.7 C) 98.1 F (36.7 C)  TempSrc: Oral  Oral Oral  SpO2: 100%  100% 100%  Weight:  69 kg    Height:        Intake/Output Summary (Last 24 hours) at 11/17/2024 1358 Last data filed at 11/16/2024 1607 Gross per 24 hour  Intake 37.48 ml  Output --  Net 37.48 ml   Filed Weights   11/12/24 0933 11/17/24 0500  Weight: 65.8 kg 69 kg    Examination:  General: No acute distress. Lungs:unlabored Neurological: Alert and oriented 3. Moves all extremities 4 with equal strength. Cranial nerves II through XII grossly  intact. Extremities: No clubbing or cyanosis. No edema.   Data Reviewed: I have personally reviewed following labs and imaging studies  CBC: Recent Labs  Lab 11/12/24 0945 11/12/24 1143 11/13/24 0431 11/13/24 0650 11/13/24 1308 11/15/24 1158 11/16/24 0225 11/17/24 0209 11/17/24 1023  WBC 7.4   < > 8.9 9.0 8.5  --  8.3 7.7  --   NEUTROABS 5.8  --   --  6.2  --   --   --   --   --   HGB 4.4*   < > 7.2* 7.0* 8.8* 8.4* 8.0* 7.3* 7.2*  HCT 15.2*   < > 22.1* 21.7* 27.1* 24.7* 24.1* 22.3* 22.2*  MCV 83.5   < > 83.7 84.4 86.6  --  84.3 85.1  --   PLT 250   < > 234 223 223  --  239 202  --    < > = values in this interval not displayed.    Basic Metabolic Panel: Recent Labs  Lab 11/12/24 0945 11/13/24 0431 11/15/24 1158 11/16/24 0225 11/17/24 0209  NA 141 139 136 138 136  K 3.5 3.9 4.3 4.5 4.1  CL 104 105 105 106 105  CO2 24 25 24 22 23   GLUCOSE 195* 107* 98 113* 141*  BUN 26* 23* 18 13 14   CREATININE 1.18* 1.14* 1.11* 1.12* 1.06*  CALCIUM  9.8 8.6* 8.5* 8.5* 8.2*  MG  --   --   --  1.9  --   PHOS  --   --   --  1.9*  --     GFR: Estimated Creatinine Clearance: 55.2 mL/min (Martesha Niedermeier) (by C-G formula based on SCr of 1.06 mg/dL (H)).  Liver Function Tests: Recent Labs  Lab 11/12/24 0945 11/13/24 0431 11/16/24 0225  AST 23 20 37  ALT 21 17 27   ALKPHOS 96 68 81  BILITOT 0.4 0.7 0.9  PROT 6.6 5.9* 6.2*  ALBUMIN 4.1 3.8 3.8    CBG: No results for input(s): GLUCAP in the last 168 hours.   No results found for this or any previous visit (from the past 240 hours).       Radiology Studies: No results found.       Scheduled Meds:  sodium chloride    Intravenous Once   atorvastatin   20 mg Oral Daily   metoprolol  tartrate  12.5 mg Oral BID   pantoprazole  (PROTONIX ) IV  40 mg Intravenous Q12H   sodium chloride  flush  3 mL Intravenous Q12H   Continuous Infusions:  heparin  900 Units/hr (11/17/24 0830)     LOS: 5 days    Time spent: over 30 min      Meliton Monte, MD [facility]   To contact the attending provider between 7A-7P or the covering provider during after hours 7P-7A, please log into the web site [email or web address] and access using universal [place] password for that web site. If you do not have the password, please call the hospital operator.  11/17/2024, 1:58 PM    "

## 2024-11-17 NOTE — Telephone Encounter (Signed)
 Auth Submission: NO AUTH NEEDED Site of care: [facility] Payer: [employer] Medication & CPT/J Code(s) submitted: Venofer  (Iron  Sucrose) J1756 Diagnosis Code: D64.9  Route of submission (phone, fax, portal):  Phone # Fax # Auth type: Buy/Bill HB Units/visits requested: 200MG  X 4 DOSES Reference number: [id number] Approval from: 11/17/2024 to 02/15/25    Dagoberto Armour, CPhT [facility] Phone: [phone] 11/17/2024

## 2024-11-17 NOTE — Telephone Encounter (Signed)
 Patient referred to infusion pharmacy team for ambulatory infusion of IV iron .  Insurance - Publishing Copy of care - Site of care: [facility] Dx code - D64.9 IV Iron  Therapy - Venofer  200mg  x 4 Infusion appointments - Scheduling team will schedule patient as soon as possible.   Venofer  200mg  x 1 dose completed on 11/17/24  Sherry Pennant, PharmD, MPH, BCPS, CPP Clinical Pharmacist

## 2024-11-17 NOTE — TOC Progression Note (Signed)
 Transition of Care [facility]) - Progression Note    Patient Details  Name: Natasha Mcknight MRN: [id number] Date of Birth: [birth year]  [facility] CM/SW Contact  Andrez JULIANNA George, RN Phone Number: 11/17/2024, 10:00 AM  Clinical Narrative:     Pt needs therapeutic INR due to mechanical valve. On IV heparin .  IP Care management following.  Expected Discharge Plan: Home/Self Care Barriers to Discharge: Continued Medical Work up               Expected Discharge Plan and Services       Living arrangements for the past 2 months: Single Family Home                                       Social Drivers of Health (SDOH) Interventions SDOH Screenings   Food Insecurity: No Food Insecurity (11/13/2024)  Housing: Low Risk (11/13/2024)  Transportation Needs: No Transportation Needs (11/13/2024)  Utilities: Not At Risk (11/13/2024)  Depression (PHQ2-9): Low Risk (10/12/2024)  Financial Resource Strain: Low Risk  (09/05/2024)   Received from [facility]  Tobacco Use: Unknown (11/12/2024)    Readmission Risk Interventions     No data to display

## 2024-11-17 NOTE — Progress Notes (Signed)
 "  Rounding Note   Patient Name: Natasha Mcknight Date of Encounter: 11/17/2024  [facility] Cardiologist: CONWAY, BENJAMIN JOHN, MD   Subjective  No complaints  Scheduled Meds:  sodium chloride    Intravenous Once   atorvastatin   20 mg Oral Daily   metoprolol  tartrate  12.5 mg Oral BID   pantoprazole  (PROTONIX ) IV  40 mg Intravenous Q12H   sodium chloride  flush  3 mL Intravenous Q12H   Continuous Infusions:  heparin  1,000 Units/hr (11/16/24 1607)   iron  sucrose     PRN Meds: acetaminophen  **OR** acetaminophen , butalbital -acetaminophen -caffeine , ondansetron  (ZOFRAN ) IV, mouth rinse, zolpidem    Vital Signs  Vitals:   11/16/24 2354 11/17/24 0357 11/17/24 0500 11/17/24 0733  BP: 114/63 111/63  124/64  Pulse: 99 91  93  Resp: 17 17  18   Temp: 98.2 F (36.8 C) 98.3 F (36.8 C)  98 F (36.7 C)  TempSrc: Oral Oral  Oral  SpO2: 100% 100%  100%  Weight:   69 kg   Height:        Intake/Output Summary (Last 24 hours) at 11/17/2024 0857 Last data filed at 11/16/2024 1607 Gross per 24 hour  Intake 437.48 ml  Output --  Net 437.48 ml      11/17/2024    5:00 AM 11/12/2024    9:33 AM 10/12/2024    4:49 PM  Last 3 Weights  Weight (lbs) 152 lb 1.9 oz 145 lb 150 lb 9.2 oz  Weight (kg) 69 kg 65.772 kg 68.3 kg      Telemetry  Sinus- Personally Reviewed  ECG  No am tracing  Physical Exam General: Well developed, well nourished, NAD  SKIN: warm, dry. Neuro: No focal deficits  Psychiatric: Mood and affect normal  Neck: No JVD Lungs:Clear bilaterally, no wheezes, rhonci, crackles Cardiovascular: Regular rate and rhythm. No murmurs, gallops or rubs. Abdomen:Soft.  Extremities: No lower extremity edema.    Labs High Sensitivity Troponin:  No results for input(s): TROPONINIHS in the last 720 hours.  Recent Labs  Lab 11/12/24 1143  TRNPT 27*       Chemistry Recent Labs  Lab 11/12/24 0945 11/13/24 0431 11/15/24 1158 11/16/24 0225 11/17/24 0209  NA 141  139 136 138 136  K 3.5 3.9 4.3 4.5 4.1  CL 104 105 105 106 105  CO2 24 25 24 22 23   GLUCOSE 195* 107* 98 113* 141*  BUN 26* 23* 18 13 14   CREATININE 1.18* 1.14* 1.11* 1.12* 1.06*  CALCIUM  9.8 8.6* 8.5* 8.5* 8.2*  MG  --   --   --  1.9  --   PROT 6.6 5.9*  --  6.2*  --   ALBUMIN 4.1 3.8  --  3.8  --   AST 23 20  --  37  --   ALT 21 17  --  27  --   ALKPHOS 96 68  --  81  --   BILITOT 0.4 0.7  --  0.9  --   GFRNONAA 53* 56* 57* 57* >60  ANIONGAP 13 9 7 10 8     Lipids No results for input(s): CHOL, TRIG, HDL, LABVLDL, LDLCALC, CHOLHDL in the last 168 hours.  Hematology Recent Labs  Lab 11/13/24 1308 11/15/24 1158 11/16/24 0225 11/17/24 0209  WBC 8.5  --  8.3 7.7  RBC 3.13*  --  2.86* 2.62*  HGB 8.8* 8.4* 8.0* 7.3*  HCT 27.1* 24.7* 24.1* 22.3*  MCV 86.6  --  84.3 85.1  MCH 28.1  --  28.0 27.9  MCHC 32.5  --  33.2 32.7  RDW 16.0*  --  16.3* 16.1*  PLT 223  --  239 202   Thyroid No results for input(s): TSH, FREET4 in the last 168 hours.  BNPNo results for input(s): BNP, PROBNP in the last 168 hours.  DDimer No results for input(s): DDIMER in the last 168 hours.   Radiology  No results found.   Patient Profile    59 y.o. female  with history of HTN, HLD, cardiomyopathy and severe AS/MR s/p recent surgical AVR/MVR with mechanical valves. She is on coumadin  chronically. She is admitted with weakness and fatigue and found to have Hgb 4.4 due to GI bleeding. INR around 3.0 on admission. She has since had an upper endoscopy on 11/16/24 which showed numerous gastric polyps.   Assessment & Plan   Aortic/Mitral valve disease s/p MVR/AVR: She is on coumadin  chronically with mechanical AVR and mechanical MVR with goal INR above 2.5. She comes in with anemia and GI bleeding with INR around 3.0. No evidence of hemolysis on labs. Coumadin  has been held during GI workup. Upper and lower endoscopy 11/16/24 with multiple gastric polyps.  Unclear best strategy to manage  her anti-coagulation given GI bleeding and gastric polyps.  She has a mechanical mitral valve and mechanical aortic valve. She is very high risk for valve thrombosis if she is not anti-coagulated.  -If coumadin  is resumed, she will need a therapeutic INR before bridging is stopped (either inpatient bridging with IV heparin  or outpatient bridging with Lovenox ). Given fall in Hgb post GI procedure, it seems best that she be watched in the hospital and remain on IV heparin  until it is clear if she is having more bleeding.    GI bleeding/Anemia: Per GI team and primary team.   Signed, Lonni Cash, MD  11/17/2024, 8:57 AM    "

## 2024-11-17 NOTE — Progress Notes (Signed)
 ANTICOAGULATION CONSULT NOTE  Pharmacy Consult for Heparin  Indication: mechanical valve  Allergies[1]  Patient Measurements: Height: 5' 4 (162.6 cm) Weight: 69 kg (152 lb 1.9 oz) IBW/kg (Calculated) : 54.7 Heparin  dosing weight: 65.8 kg  Vital Signs: Temp: 98 F (36.7 C) (01/09 0733) Temp Source: Oral (01/09 0733) BP: 124/64 (01/09 0733) Pulse Rate: 93 (01/09 0733)  Labs: Recent Labs    11/15/24 0157 11/15/24 0859 11/15/24 1158 11/15/24 1158 11/16/24 0225 11/16/24 2050 11/17/24 0209  HGB  --   --  8.4*   < > 8.0*  --  7.3*  HCT  --   --  24.7*  --  24.1*  --  22.3*  PLT  --   --   --   --  239  --  202  LABPROT 24.4*  --   --   --  19.0*  --  17.2*  INR 2.1*  --   --   --  1.5*  --  1.3*  HEPARINUNFRC  --  0.47  --   --   --  0.40 0.58  CREATININE  --   --  1.11*  --  1.12*  --  1.06*   < > = values in this interval not displayed.    Estimated Creatinine Clearance: 55.2 mL/min (A) (by C-G formula based on SCr of 1.06 mg/dL (H)).   Assessment: 5 yoF admitted with symptomatic anemia and suspected GIB. 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 Heparin  started this am with INR < 2.5.  Today's INR down to 1.3.  Coumadin  on hold  Goal of Therapy:  INR Goal 2.5-3.5 HL 0.3-0.5 Monitor platelets by anticoagulation protocol: Yes  Plan:  Decrease IV heparin  to 900 units/hr.  Will target lower end of goal range. Daily heparin  level, INR and CBC.  Harlene Barlow, Berdine JONETTA CORP, BCCP Clinical Pharmacist  11/17/2024 8:02 AM   [facility] pharmacy phone numbers are listed on [email or web address]         [1]  Allergies Allergen Reactions   Ace Inhibitors Cough and Shortness Of Breath   Vancomycin Anaphylaxis and Other (See Comments)    Other Reaction: severe hypotension   Iodine    Latex

## 2024-11-18 DIAGNOSIS — D649 Anemia, unspecified: Secondary | ICD-10-CM | POA: Diagnosis not present

## 2024-11-18 LAB — CBC
HCT: 25.9 % — ABNORMAL LOW (ref 36.0–46.0)
HCT: 26.6 % — ABNORMAL LOW (ref 36.0–46.0)
Hemoglobin: 8.4 g/dL — ABNORMAL LOW (ref 12.0–15.0)
Hemoglobin: 8.8 g/dL — ABNORMAL LOW (ref 12.0–15.0)
MCH: 27.8 pg (ref 26.0–34.0)
MCH: 27.8 pg (ref 26.0–34.0)
MCHC: 32.4 g/dL (ref 30.0–36.0)
MCHC: 33.1 g/dL (ref 30.0–36.0)
MCV: 85.8 fL (ref 80.0–100.0)
MCV: 84.2 fL (ref 80.0–100.0)
Platelets: 215 K/uL (ref 150–400)
Platelets: 218 K/uL (ref 150–400)
RBC: 3.02 MIL/uL — ABNORMAL LOW (ref 3.87–5.11)
RBC: 3.16 MIL/uL — ABNORMAL LOW (ref 3.87–5.11)
RDW: 15.5 % (ref 11.5–15.5)
RDW: 15.6 % — ABNORMAL HIGH (ref 11.5–15.5)
WBC: 8.6 K/uL (ref 4.0–10.5)
WBC: 8.4 K/uL (ref 4.0–10.5)
nRBC: 0.2 % (ref 0.0–0.2)
nRBC: 0.2 % (ref 0.0–0.2)

## 2024-11-18 LAB — BPAM RBC
Blood Product Expiration Date: 202602022359
ISSUE DATE / TIME: 202601091722
Unit Type and Rh: 5100

## 2024-11-18 LAB — PHOSPHORUS: Phosphorus: 1.9 mg/dL — ABNORMAL LOW (ref 2.5–4.6)

## 2024-11-18 LAB — HEPARIN LEVEL (UNFRACTIONATED)
Heparin Unfractionated: 0.59 [IU]/mL (ref 0.30–0.70)
Heparin Unfractionated: 0.4 [IU]/mL (ref 0.30–0.70)

## 2024-11-18 LAB — BASIC METABOLIC PANEL WITH GFR
Anion gap: 8 (ref 5–15)
BUN: 11 mg/dL (ref 6–20)
CO2: 22 mmol/L (ref 22–32)
Calcium: 8.4 mg/dL — ABNORMAL LOW (ref 8.9–10.3)
Chloride: 104 mmol/L (ref 98–111)
Creatinine, Ser: 1.02 mg/dL — ABNORMAL HIGH (ref 0.44–1.00)
GFR, Estimated: >60 mL/min
Glucose, Bld: 130 mg/dL — ABNORMAL HIGH (ref 70–99)
Potassium: 4.1 mmol/L (ref 3.5–5.1)
Sodium: 135 mmol/L (ref 135–145)

## 2024-11-18 LAB — TYPE AND SCREEN
ABO/RH(D): O POS
Antibody Screen: NEGATIVE
Unit division: 0

## 2024-11-18 LAB — MAGNESIUM: Magnesium: 1.6 mg/dL — ABNORMAL LOW (ref 1.7–2.4)

## 2024-11-18 LAB — PROTIME-INR
INR: 1.2 (ref 0.8–1.2)
Prothrombin Time: 16.2 s — ABNORMAL HIGH (ref 11.4–15.2)

## 2024-11-18 MED ORDER — K PHOS MONO-SOD PHOS DI & MONO 155-852-130 MG PO TABS
500.0000 mg | ORAL_TABLET | Freq: Four times a day (QID) | ORAL | Status: AC
  Administered 2024-11-18 (×4): 500 mg via ORAL
  Filled 2024-11-18 (×4): qty 2

## 2024-11-18 MED ORDER — MAGNESIUM SULFATE 2 GM/50ML IV SOLN
2.0000 g | Freq: Once | INTRAVENOUS | Status: AC
  Administered 2024-11-18: 2 g via INTRAVENOUS
  Filled 2024-11-18: qty 50

## 2024-11-18 MED ORDER — WARFARIN SODIUM 5 MG PO TABS
5.0000 mg | ORAL_TABLET | Freq: Once | ORAL | Status: AC
  Administered 2024-11-18: 5 mg via ORAL
  Filled 2024-11-18: qty 1

## 2024-11-18 MED ORDER — SIMETHICONE 80 MG PO CHEW
40.0000 mg | CHEWABLE_TABLET | Freq: Four times a day (QID) | ORAL | Status: DC | PRN
  Administered 2024-11-18: 40 mg via ORAL
  Filled 2024-11-18: qty 1

## 2024-11-18 MED ORDER — WARFARIN - PHARMACIST DOSING INPATIENT: Freq: Every day | Status: DC

## 2024-11-18 NOTE — Progress Notes (Signed)
 ANTICOAGULATION CONSULT NOTE  Pharmacy Consult for Heparin  + Warfarin Indication: mechanical valve  Allergies[1]  Patient Measurements: Height: 5' 4 (162.6 cm) Weight: 71.8 kg (158 lb 4.6 oz) IBW/kg (Calculated) : 54.7 Heparin  dosing weight: 65.8 kg  Vital Signs: Temp: 98.6 F (37 C) (01/10 1527) Temp Source: Oral (01/10 1527) BP: 123/71 (01/10 1527) Pulse Rate: 90 (01/10 1527)  Labs: Recent Labs    11/16/24 0225 11/16/24 2050 11/17/24 0209 11/17/24 1023 11/18/24 0143 11/18/24 1154 11/18/24 1555  HGB 8.0*  --  7.3* 7.2* 8.4* 8.8*  --   HCT 24.1*  --  22.3* 22.2* 25.9* 26.6*  --   PLT 239  --  202  --  215 218  --   LABPROT 19.0*  --  17.2*  --  16.2*  --   --   INR 1.5*  --  1.3*  --  1.2  --   --   HEPARINUNFRC  --    < > 0.58  --  0.59  --  0.40  CREATININE 1.12*  --  1.06*  --  1.02*  --   --    < > = values in this interval not displayed.    Estimated Creatinine Clearance: 58.4 mL/min (A) (by C-G formula based on SCr of 1.02 mg/dL (H)).   Assessment: 47 yoF admitted with symptomatic anemia and suspected GIB. 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 Heparin  started this am with INR < 2.5.   1/10 PM Consulted to reinitiate warfarin. Current INR 1.2. Home dose warfarin 5mg  daily. Heparin  level therapeutic at 0.40.  Goal of Therapy:  INR Goal 2.5-3.5 HL 0.3-0.5 Monitor platelets by anticoagulation protocol: Yes  Plan:  Warfarin 5mg  x 1 dose now Continue IV heparin  at 850 units/hr. Daily heparin  level, INR and CBC. Monitor for signs of bleeding / pauses w/ hep gtt and report to Pharmacy and provider    Larraine Brazier, PharmD Clinical Pharmacist 11/18/2024  4:38 PM **Pharmacist phone directory can now be found on [email or web address] ([email or web address]).  Listed under [facility].       [1]  Allergies Allergen Reactions   Ace Inhibitors Cough and Shortness Of Breath   Vancomycin Anaphylaxis and Other (See Comments)     Other Reaction: severe hypotension   Iodine    Latex

## 2024-11-18 NOTE — Progress Notes (Signed)
"  °  Progress Note Patient Name: Natasha Mcknight Date of Encounter: 11/18/2024 [facility] Cardiologist: CONWAY, BENJAMIN JOHN, MD   Interval Summary   No events overnight. She denies any CP or dyspnea.  Vital Signs Vitals:   11/17/24 2320 11/18/24 0414 11/18/24 0500 11/18/24 0746  BP: (!) 145/67 123/65  135/65  Pulse:  86  90  Resp: 19 17  17   Temp: 98.4 F (36.9 C) 98 F (36.7 C)  98.2 F (36.8 C)  TempSrc: Oral Oral  Oral  SpO2: 100% 100%  100%  Weight:   71.8 kg   Height:        Intake/Output Summary (Last 24 hours) at 11/18/2024 0957 Last data filed at 11/17/2024 2200 Gross per 24 hour  Intake 535 ml  Output --  Net 535 ml      11/18/2024    5:00 AM 11/17/2024    5:00 AM 11/12/2024    9:33 AM  Last 3 Weights  Weight (lbs) 158 lb 4.6 oz 152 lb 1.9 oz 145 lb  Weight (kg) 71.8 kg 69 kg 65.772 kg      Telemetry/ECG  NSR - Personally Reviewed  Physical Exam  GEN: No acute distress.   Neck: No JVD Cardiac: RRR, no murmurs, rubs, or gallops.  Respiratory: Clear to auscultation bilaterally. GI: Soft, nontender, non-distended  MS: No edema  Assessment & Plan  Ms. Tucci is a 59 year old female with history of AS/MR s/p recent mechanical AVR/MVR (9/25) who presented with anemia and hemoglobin of 4.4 and INR of 3.  She subsequently had EGD that showed multiple gastric polyps.  She was started on IV heparin  on 1/8 with plan to transition to warfarin.  She required a unit of blood on 1/9 for hemoglobin of 7.2.  #Anemia #AS/MR s/p mechanical AVR/MVR (9/25) - Denies any melena or blood in the stool today. - Hemoglobin this morning was stable following transfusion.  Plan to repeat hemoglobin at noon and will start warfarin tonight if hemoglobin is stable. - Continue heparin  drip. - We will continue to follow up  For questions or updates, please contact [facility] Please consult [email or web address] for contact info under    Signed, Joelle VEAR Ren Donley, MD  "

## 2024-11-18 NOTE — Plan of Care (Signed)
  Problem: Health Behavior/Discharge Planning: Goal: Ability to manage health-related needs will improve Outcome: Progressing   Problem: Clinical Measurements: Goal: Will remain free from infection Outcome: Progressing   Problem: Nutrition: Goal: Adequate nutrition will be maintained Outcome: Progressing   Problem: Pain Managment: Goal: General experience of comfort will improve and/or be controlled Outcome: Progressing   Problem: Safety: Goal: Ability to remain free from injury will improve Outcome: Progressing

## 2024-11-18 NOTE — Progress Notes (Signed)
 ANTICOAGULATION CONSULT NOTE  Pharmacy Consult for Heparin  Indication: mechanical valve  Allergies[1]  Patient Measurements: Height: 5' 4 (162.6 cm) Weight: 69 kg (152 lb 1.9 oz) IBW/kg (Calculated) : 54.7 Heparin  dosing weight: 65.8 kg  Vital Signs: Temp: 98 F (36.7 C) (01/10 0414) Temp Source: Oral (01/10 0414) BP: 123/65 (01/10 0414) Pulse Rate: 86 (01/10 0414)  Labs: Recent Labs    11/16/24 0225 11/16/24 2050 11/17/24 0209 11/17/24 1023 11/18/24 0143  HGB 8.0*  --  7.3* 7.2* 8.4*  HCT 24.1*  --  22.3* 22.2* 25.9*  PLT 239  --  202  --  215  LABPROT 19.0*  --  17.2*  --  16.2*  INR 1.5*  --  1.3*  --  1.2  HEPARINUNFRC  --  0.40 0.58  --  0.59  CREATININE 1.12*  --  1.06*  --  1.02*    Estimated Creatinine Clearance: 57.3 mL/min (A) (by C-G formula based on SCr of 1.02 mg/dL (H)).   Assessment: 62 yoF admitted with symptomatic anemia and suspected GIB. 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 Heparin  started this am with INR < 2.5.  Coumadin  on hold  1/10 AM update: HL 0.59 Hgb 8.4 PLT wnl No signs of bleeding / pauses w/ gtt  Goal of Therapy:  INR Goal 2.5-3.5 HL 0.3-0.5 Monitor platelets by anticoagulation protocol: Yes  Plan:  Decrease IV heparin  to 850 units/hr. Check HL in 8 hours  Daily heparin  level, INR and CBC. Monitor for signs of bleeding / pauses w/ hep gtt and report to Pharmacy and provider    Benedetta Heath BS, PharmD, BCPS Clinical Pharmacist 11/18/2024 7:13 AM  Contact: [phone] after 3 PM    [1]  Allergies Allergen Reactions   Ace Inhibitors Cough and Shortness Of Breath   Vancomycin Anaphylaxis and Other (See Comments)    Other Reaction: severe hypotension   Iodine    Latex

## 2024-11-18 NOTE — Progress Notes (Signed)
 " PROGRESS NOTE     Natasha Mcknight  FMW:[id number] DOB: [birth year] DOA: 11/12/2024 PCP: Vernon Velna SAUNDERS, MD  Chief Complaint  Patient presents with   Dizziness   Emesis    Brief Narrative:   59 year old female with HTN, GERD, status post mechanical and aortic valve replacement in September 2025 at [facility], comes into the hospital with weakness, dizziness. This has been going on roughly acutely over the last 3 to 4 days. She has been nauseous during that time. She denies any chest pains. She denies any frank bleeding in her stool but did notice that her stools are darker than normal. In the ED she was found to have Zlaty Alexa hemoglobin of 4.4, fecal occult was positive. She was admitted to the hospital, and GI consulted. Of note, EDP reached out to [facility] cardiothoracic surgery who said that patient could be admitted here and get it 2D echo followed by TEE, and if any issues with the valve to reach back for potential transfer.   TEE deferred here after discussion with cardiology given lack of clinically significant hemolysis.   Now pending GI eval, upper and lower endoscopy planned for 1/8.  Assessment & Plan:   Principal Problem:   Symptomatic anemia Active Problems:   GERD   Essential hypertension with goal blood pressure less than 140/90   S/P mitral valve repair   H/O mitral valve replacement with mechanical valve   S/P AVR (aortic valve replacement)   Acquired hemolytic anemia (HCC)   Mitral valve disease   Chronic anticoagulation   Gastrointestinal hemorrhage   Myocardiopathy (HCC)  Symptomatic anemia, positive FOBT Iron  Deficiency patient presented to the hospital with Natasha Mcknight hemoglobin of 4.4.   Haptoglobin normal, bilirubin wnl, LDH is elevated - not suggestive of hemolysis  + Gastroenterology consulted, appreciate input.  Planning for upper and lower endoscopy 1/8. - s/p EGD 1/8 notable for dozens of gastric polyps, 16 removed with partial retrieval, polyps appeared c/w  fundic gland polyps - suspect iron  def anemia likely due to chronic intermittent oozing.  Follow pending biopsy.  - colonoscopy 1/8 with diverticulosis in ascending colon - s/p 4 units pRBC - continue to trend - s/p IV iron  and refer for outpatient IV iron  as well - significant iron  def anemia, ferritin 11 with low iron .  Normal B12, folate. - continue to monitor H/H - maintain active type and screen   History of mitral and aortic mechanical valves -replaced at [facility] in September 2024.  She was placed on warfarin since then.  She was not on anticoagulation prior to that. - INR is 1.2 today, warfarin to resume today - continue heparin  gtt - appreciate cardiology recommendations-   Essential hypertension-continue home metoprolol , blood pressure is stable   CKD 3A Creatinine appears to be lower than recent baseline   Type 2 diabetes mellitus BG's ok, follow A1c here - not currently receiving any insulin   Chronic diastolic CHF  Echo with EF 55-60%, indeterminate diastolic parameters (notably grade III diastolic dysfunction on echo from 02/2024) Lasix currently on hold Strict I/O, daily weights  Hyperlipidemia lipitor    DVT prophylaxis: heparin  gtt Code Status: full Family Communication: none Disposition:   Status is: Inpatient Remains inpatient appropriate because: need for continued inpatient care   Consultants:  Cardiology gastroenterology  Procedures:  Echo IMPRESSIONS     1. Left ventricular ejection fraction, by estimation, is 55 to 60%. The  left ventricle has normal function. The left ventricle has no regional  wall  motion abnormalities. Left ventricular diastolic parameters are  indeterminate.   2. Right ventricular systolic function is normal. The right ventricular  size is mildly enlarged. There is normal pulmonary artery systolic  pressure.   3. Left atrial size was moderately dilated.   4. The mitral valve has been repaired/replaced. No evidence of  mitral  valve regurgitation. No evidence of mitral stenosis. There is Tyne Banta 23 mm  Regent mechanical valve present in the mitral position. Echo findings are  consistent with normal structure and  function of the mitral valve prosthesis.   5. Tricuspid valve regurgitation is moderate.   6. The aortic valve has been repaired/replaced. Aortic valve  regurgitation is not visualized. No aortic stenosis is present. There is [name]  19 mm Regent mechanical valve present in the aortic position. Procedure  Date: 9/25. Echo findings are consistent with   normal structure and function of the aortic valve prosthesis.   7. The inferior vena cava is normal in size with greater than 50%  respiratory variability, suggesting right atrial pressure of 3 mmHg.   8. Agitated saline contrast bubble study was negative, with no evidence  of any interatrial shunt.   Conclusion(s)/Recommendation(s): No intracardiac source of embolism  detected on this transthoracic study. Consider [name] transesophageal  echocardiogram to exclude cardiac source of embolism if clinically  indicated.   Antimicrobials:  Anti-infectives (From admission, onward)    None       Subjective: No complaints Discussed plan for anticoagulation  Objective: Vitals:   11/18/24 0746 11/18/24 1020 11/18/24 1155 11/18/24 1527  BP: 135/65 132/60 137/77 123/71  Pulse: 90 87 85 90  Resp: 17  15 15   Temp: 98.2 F (36.8 C)  98.2 F (36.8 C) 98.6 F (37 C)  TempSrc: Oral  Oral Oral  SpO2: 100%  100% 100%  Weight:      Height:        Intake/Output Summary (Last 24 hours) at 11/18/2024 1623 Last data filed at 11/17/2024 2200 Gross per 24 hour  Intake 535 ml  Output --  Net 535 ml   Filed Weights   11/12/24 0933 11/17/24 0500 11/18/24 0500  Weight: 65.8 kg 69 kg 71.8 kg    Examination:  General: No acute distress. Lungs: unlabored Neurological: Alert and oriented 3. Moves all extremities 4 with equal strength. Cranial nerves II through  XII grossly intact. Extremities: No clubbing or cyanosis. No edema.  Data Reviewed: I have personally reviewed following labs and imaging studies  CBC: Recent Labs  Lab 11/12/24 0945 11/12/24 1143 11/13/24 0650 11/13/24 1308 11/15/24 1158 11/16/24 0225 11/17/24 0209 11/17/24 1023 11/18/24 0143 11/18/24 1154  WBC 7.4   < > 9.0 8.5  --  8.3 7.7  --  8.6 8.4  NEUTROABS 5.8  --  6.2  --   --   --   --   --   --   --   HGB 4.4*   < > 7.0* 8.8*   < > 8.0* 7.3* 7.2* 8.4* 8.8*  HCT 15.2*   < > 21.7* 27.1*   < > 24.1* 22.3* 22.2* 25.9* 26.6*  MCV 83.5   < > 84.4 86.6  --  84.3 85.1  --  85.8 84.2  PLT 250   < > 223 223  --  239 202  --  215 218   < > = values in this interval not displayed.    Basic Metabolic Panel: Recent Labs  Lab 11/13/24 0431 11/15/24 1158 11/16/24  0225 11/17/24 0209 11/18/24 0143  NA 139 136 138 136 135  K 3.9 4.3 4.5 4.1 4.1  CL 105 105 106 105 104  CO2 25 24 22 23 22   GLUCOSE 107* 98 113* 141* 130*  BUN 23* 18 13 14 11   CREATININE 1.14* 1.11* 1.12* 1.06* 1.02*  CALCIUM  8.6* 8.5* 8.5* 8.2* 8.4*  MG  --   --  1.9  --  1.6*  PHOS  --   --  1.9*  --  1.9*    GFR: Estimated Creatinine Clearance: 58.4 mL/min (Arnice Vanepps) (by C-G formula based on SCr of 1.02 mg/dL (H)).  Liver Function Tests: Recent Labs  Lab 11/12/24 0945 11/13/24 0431 11/16/24 0225  AST 23 20 37  ALT 21 17 27   ALKPHOS 96 68 81  BILITOT 0.4 0.7 0.9  PROT 6.6 5.9* 6.2*  ALBUMIN 4.1 3.8 3.8    CBG: No results for input(s): GLUCAP in the last 168 hours.   No results found for this or any previous visit (from the past 240 hours).       Radiology Studies: No results found.       Scheduled Meds:  sodium chloride    Intravenous Once   sodium chloride    Intravenous Once   atorvastatin   20 mg Oral Daily   metoprolol  tartrate  12.5 mg Oral BID   pantoprazole  (PROTONIX ) IV  40 mg Intravenous Q12H   phosphorus  500 mg Oral QID   sodium chloride  flush  3 mL Intravenous Q12H    Continuous Infusions:  heparin  850 Units/hr (11/18/24 0742)     LOS: 6 days    Time spent: over 30 min     Meliton Monte, MD [facility]   To contact the attending provider between 7A-7P or the covering provider during after hours 7P-7A, please log into the web site [email or web address] and access using universal [place] password for that web site. If you do not have the password, please call the hospital operator.  11/18/2024, 4:23 PM    "

## 2024-11-18 NOTE — Plan of Care (Signed)
" °  Problem: Clinical Measurements: Goal: Diagnostic test results will improve Outcome: Progressing   Problem: Clinical Measurements: Goal: Ability to maintain clinical measurements within normal limits will improve Outcome: Progressing   Problem: Clinical Measurements: Goal: Will remain free from infection Outcome: Progressing   Problem: Health Behavior/Discharge Planning: Goal: Ability to manage health-related needs will improve Outcome: Progressing   Problem: Education: Goal: Knowledge of General Education information will improve Description: Including pain rating scale, medication(s)/side effects and non-pharmacologic comfort measures Outcome: Progressing   Problem: Clinical Measurements: Goal: Cardiovascular complication will be avoided Outcome: Progressing   Problem: Nutrition: Goal: Adequate nutrition will be maintained Outcome: Progressing   Problem: Elimination: Goal: Will not experience complications related to urinary retention Outcome: Progressing   Problem: Elimination: Goal: Will not experience complications related to bowel motility Outcome: Progressing   Problem: Pain Managment: Goal: General experience of comfort will improve and/or be controlled Outcome: Progressing   Problem: Safety: Goal: Ability to remain free from injury will improve Outcome: Progressing   "

## 2024-11-19 ENCOUNTER — Other Ambulatory Visit (HOSPITAL_COMMUNITY): Payer: Self-pay

## 2024-11-19 LAB — HEPARIN LEVEL (UNFRACTIONATED): Heparin Unfractionated: 0.37 [IU]/mL (ref 0.30–0.70)

## 2024-11-19 LAB — CBC
HCT: 24.7 % — ABNORMAL LOW (ref 36.0–46.0)
Hemoglobin: 8.3 g/dL — ABNORMAL LOW (ref 12.0–15.0)
MCH: 28.4 pg (ref 26.0–34.0)
MCHC: 33.6 g/dL (ref 30.0–36.0)
MCV: 84.6 fL (ref 80.0–100.0)
Platelets: 210 K/uL (ref 150–400)
RBC: 2.92 MIL/uL — ABNORMAL LOW (ref 3.87–5.11)
RDW: 15.9 % — ABNORMAL HIGH (ref 11.5–15.5)
WBC: 8.7 K/uL (ref 4.0–10.5)
nRBC: 0.3 % — ABNORMAL HIGH (ref 0.0–0.2)

## 2024-11-19 LAB — PROTIME-INR
INR: 1.2 (ref 0.8–1.2)
Prothrombin Time: 15.6 s — ABNORMAL HIGH (ref 11.4–15.2)

## 2024-11-19 MED ORDER — METOPROLOL SUCCINATE ER 25 MG PO TB24
25.0000 mg | ORAL_TABLET | Freq: Every day | ORAL | 0 refills | Status: AC
  Filled 2024-11-19: qty 30, 30d supply, fill #0

## 2024-11-19 MED ORDER — METOPROLOL SUCCINATE ER 25 MG PO TB24
25.0000 mg | ORAL_TABLET | Freq: Every day | ORAL | Status: DC
  Administered 2024-11-19: 25 mg via ORAL
  Filled 2024-11-19: qty 1

## 2024-11-19 MED ORDER — PANTOPRAZOLE SODIUM 40 MG PO TBEC
40.0000 mg | DELAYED_RELEASE_TABLET | Freq: Two times a day (BID) | ORAL | 0 refills | Status: AC
  Filled 2024-11-19: qty 120, 60d supply, fill #0

## 2024-11-19 MED ORDER — SUCRALFATE 1 G PO TABS
1.0000 g | ORAL_TABLET | Freq: Four times a day (QID) | ORAL | 0 refills | Status: AC
  Filled 2024-11-19: qty 56, 14d supply, fill #0

## 2024-11-19 MED ORDER — ENOXAPARIN SODIUM 80 MG/0.8ML IJ SOSY
1.0000 mg/kg | PREFILLED_SYRINGE | Freq: Two times a day (BID) | INTRAMUSCULAR | 0 refills | Status: AC
  Filled 2024-11-19: qty 11.2, 8d supply, fill #0

## 2024-11-19 MED ORDER — WARFARIN SODIUM 7.5 MG PO TABS: 7.5000 mg | ORAL_TABLET | Freq: Once | ORAL | Status: DC

## 2024-11-19 MED ORDER — ENOXAPARIN SODIUM 80 MG/0.8ML IJ SOSY
1.0000 mg/kg | PREFILLED_SYRINGE | Freq: Two times a day (BID) | INTRAMUSCULAR | Status: DC
  Administered 2024-11-19: 70 mg via SUBCUTANEOUS
  Filled 2024-11-19: qty 0.8

## 2024-11-19 NOTE — Progress Notes (Signed)
 Discharged to home accompanied by Angelia, NT.

## 2024-11-19 NOTE — Progress Notes (Signed)
 ANTICOAGULATION CONSULT NOTE  Pharmacy Consult for Lovenox  + Warfarin Indication: mechanical valve  Allergies[1]  Patient Measurements: Height: 5' 4 (162.6 cm) Weight: 70.6 kg (155 lb 10.3 oz) IBW/kg (Calculated) : 54.7 Heparin  dosing weight: 65.8 kg  Vital Signs: Temp: 97.4 F (36.3 C) (01/11 0728) Temp Source: Oral (01/11 0728) BP: 131/60 (01/11 0728) Pulse Rate: 82 (01/11 0728)  Labs: Recent Labs    11/17/24 0209 11/17/24 1023 11/18/24 0143 11/18/24 1154 11/18/24 1555 11/19/24 0145  HGB 7.3*   < > 8.4* 8.8*  --  8.3*  HCT 22.3*   < > 25.9* 26.6*  --  24.7*  PLT 202  --  215 218  --  210  LABPROT 17.2*  --  16.2*  --   --  15.6*  INR 1.3*  --  1.2  --   --  1.2  HEPARINUNFRC 0.58  --  0.59  --  0.40 0.37  CREATININE 1.06*  --  1.02*  --   --   --    < > = values in this interval not displayed.    Estimated Creatinine Clearance: 58 mL/min (A) (by C-G formula based on SCr of 1.02 mg/dL (H)).   Assessment: 37 yoF admitted with symptomatic anemia and suspected GIB. Pt on warfarin PTA for hx mMVR/mAVR (INR goal 2.5-3.5) which was held for GI workup s/p EGD on 1/8 . Patient initially initiated on heparin  infusion for bridge therapy while INR < 2.5 but transitioned to lovenox  (1/11) per MD. PTA home regimen is warfarin 5 mg daily. Pharmacy also consulted to re-initiate warfarin on 1/10.   1/11 update: Transitioning from heparin  drip to Lovenox  70 mg (~1 mg/kg) every 12 hours and tentatively continue while INR<2.5 for bridge therapy.   Lovenox :  - Lovenox  70 mg (~1 mg/kg) every 12 hours  - No documented signs/symptoms of bleeding or issues with infusion  - Hemoglobin numerically stable (8.3), platelets stable (210)  - Tentatively plan to continue while INR <2.5   Warfarin:  - INR remains subtherapeutic at 1.2 likely secondary to held doses - Will give a boosted dose today of warfarin 7.5 mg (usual PTA home dose of 5 mg)  - Potential DDI with PRN  butalbital /acetaminophen /caffeine  (may increase warfarin's affect) - will monitor  Date INR Dose Notes  1/10 1.2 5 mg  Warfarin re-initiated   1/11 1.2 7.5 mg  Switch from heparin  drip to Lovenox           Goal of Therapy:  INR Goal 2.5-3.5 Monitor platelets by anticoagulation protocol: Yes  Plan:  Warfarin 7.5mg  x 1 dose tonight Stop IV heparin  infusion  Initiate Lovenox  70 mg every 12 hours today and tentatively continue while INR<2.5 Daily INR and CBC. Monitor for signs of bleeding / pauses w/ hep gtt and report to Pharmacy and provider  Feliciano Close, PharmD PGY2 Infectious Diseases Pharmacy Resident     [1]  Allergies Allergen Reactions   Ace Inhibitors Cough and Shortness Of Breath   Vancomycin Anaphylaxis and Other (See Comments)    Other Reaction: severe hypotension   Iodine    Latex

## 2024-11-19 NOTE — Plan of Care (Signed)
" °  Problem: Health Behavior/Discharge Planning: Goal: Ability to manage health-related needs will improve Outcome: Progressing   Problem: Clinical Measurements: Goal: Will remain free from infection Outcome: Progressing   Problem: Clinical Measurements: Goal: Diagnostic test results will improve Outcome: Progressing   Problem: Nutrition: Goal: Adequate nutrition will be maintained Outcome: Progressing   Problem: Safety: Goal: Ability to remain free from injury will improve Outcome: Progressing   Problem: Education: Goal: Knowledge of General Education information will improve Description: Including pain rating scale, medication(s)/side effects and non-pharmacologic comfort measures Outcome: Progressing   "

## 2024-11-19 NOTE — Progress Notes (Signed)
"  °  Progress Note  Patient Name: Atheena Spano Date of Encounter: 11/19/2024 [facility] Cardiologist: CONWAY, BENJAMIN JOHN, MD   Interval Summary   No CP or dyspnea today. Also denies melena or hematochezia.  Vital Signs Vitals:   11/18/24 1527 11/18/24 2120 11/18/24 2326 11/19/24 0329  BP: 123/71 (!) 140/67 137/69 133/62  Pulse: 90 94 93 88  Resp: 15 18 16 20   Temp: 98.6 F (37 C) 98.3 F (36.8 C) 98.2 F (36.8 C) (!) 97.3 F (36.3 C)  TempSrc: Oral Oral Oral Oral  SpO2: 100% 100% 100% 96%  Weight:      Height:        Intake/Output Summary (Last 24 hours) at 11/19/2024 0655 Last data filed at 11/18/2024 2221 Gross per 24 hour  Intake 1190 ml  Output --  Net 1190 ml      11/18/2024    5:00 AM 11/17/2024    5:00 AM 11/12/2024    9:33 AM  Last 3 Weights  Weight (lbs) 158 lb 4.6 oz 152 lb 1.9 oz 145 lb  Weight (kg) 71.8 kg 69 kg 65.772 kg      Telemetry/ECG  NSR - Personally Reviewed  Physical Exam  GEN: No acute distress.   Neck: No JVD Cardiac: RRR, no murmurs, rubs, or gallops.  Respiratory: Clear to auscultation bilaterally. GI: Soft, nontender, non-distended  MS: No edema  Assessment & Plan  Ms. Tippetts is a 59 year old female with history of AS/MR s/p recent mechanical AVR/MVR (9/25) who presented with anemia and hemoglobin of 4.4 and INR of 3.  She subsequently had EGD that showed multiple gastric polyps.  She was started on IV heparin  on 1/8 with plan to transition to warfarin.  She required a unit of blood on 1/9 for hemoglobin of 7.2.   #Anemia #AS/MR s/p mechanical AVR/MVR (9/25) - No evidence of recurrent GIB - Hgb stable; if PM Hgb stable, patient can d/c with lovenox  and warfarin outpatient with close follow up - Transition metop tartrate to XL25 - Continue heparin  drip until discharge - Will hold aspirin indefinitely for now - We will sign off; patient has been following up with [facility]  For questions or updates, please  contact [facility] Please consult [email or web address] for contact info under   Signed, Joelle VEAR Ren Donley, MD  "

## 2024-11-19 NOTE — Progress Notes (Signed)
 ANTICOAGULATION CONSULT NOTE  Pharmacy Consult for Heparin  + Warfarin Indication: mechanical valve  Allergies[1]  Patient Measurements: Height: 5' 4 (162.6 cm) Weight: 71.8 kg (158 lb 4.6 oz) IBW/kg (Calculated) : 54.7 Heparin  dosing weight: 65.8 kg  Vital Signs: Temp: 97.3 F (36.3 C) (01/11 0329) Temp Source: Oral (01/11 0329) BP: 133/62 (01/11 0329) Pulse Rate: 88 (01/11 0329)  Labs: Recent Labs    11/17/24 0209 11/17/24 1023 11/18/24 0143 11/18/24 1154 11/18/24 1555 11/19/24 0145  HGB 7.3*   < > 8.4* 8.8*  --  8.3*  HCT 22.3*   < > 25.9* 26.6*  --  24.7*  PLT 202  --  215 218  --  210  LABPROT 17.2*  --  16.2*  --   --  15.6*  INR 1.3*  --  1.2  --   --  1.2  HEPARINUNFRC 0.58  --  0.59  --  0.40 0.37  CREATININE 1.06*  --  1.02*  --   --   --    < > = values in this interval not displayed.    Estimated Creatinine Clearance: 58.4 mL/min (A) (by C-G formula based on SCr of 1.02 mg/dL (H)).   Assessment: 30 yoF admitted with symptomatic anemia and suspected GIB. Pt on warfarin PTA for hx mMVR/mAVR (INR goal 2.5-3.5) which was held for GI workup s/p EGD on 1/8 . Heparin  started for bridge therapy while INR < 2.5. PTA home regimen is warfarin 5 mg daily. Pharmacy consulted to re-initiate warfarin on 1/10.   Heparin  infusion:  - Heparin  level remains therapeutic at 0.37 - No documented signs/symptoms of bleeding or issues with infusion  - Hemoglobin numerically stable (8.3), platelets stable (210)  - Tentatively plan to continue while INR <2.5   Warfarin:  - INR remains subtherapeutic at 1.2 likely secondary to held doses - Will give a boosted dose today of warfarin 7.5 mg (usual PTA home dose of 5 mg)  - Potential DDI with PRN butalbital /acetaminophen /caffeine  (may increase warfarin's affect) - will monitor  Date INR Dose Notes  1/10 1.2 5 mg  Warfarin re-initiated   1/11 1.2 7.5 mg            Goal of Therapy:  INR Goal 2.5-3.5 Heparin  level  0.3-0.5 Monitor platelets by anticoagulation protocol: Yes  Plan:  Warfarin 7.5mg  x 1 dose tonight Continue IV heparin  at 850 units/hr while INR <2.5 Daily heparin  level, INR and CBC. Monitor for signs of bleeding / pauses w/ hep gtt and report to Pharmacy and provider  Feliciano Close, PharmD PGY2 Infectious Diseases Pharmacy Resident        [1]  Allergies Allergen Reactions   Ace Inhibitors Cough and Shortness Of Breath   Vancomycin Anaphylaxis and Other (See Comments)    Other Reaction: severe hypotension   Iodine    Latex

## 2024-11-19 NOTE — Discharge Summary (Signed)
 Physician Discharge Summary  Natasha Mcknight FMW:[id number] DOB: 07-05-1966 DOA: 11/12/2024  PCP: Vernon Velna SAUNDERS, MD  Admit date: 11/12/2024 Discharge date: 11/19/2024  Time spent: 40 minutes  Recommendations for Outpatient Follow-up:  Follow outpatient CBC/CMP  Follow INR with PCP outpatient, discharged on lovenox  bridge Follow pending surg pathology Follow with GI for repeat endoscopy in 2-6 months Follow outpatient for IV iron  - referral made Follow with cardiology outpatient. Aspirin on hold for now.  Discharge Diagnoses:  Principal Problem:   Symptomatic anemia Active Problems:   GERD   Essential hypertension with goal blood pressure less than 140/90   S/P mitral valve repair   H/O mitral valve replacement with mechanical valve   S/P AVR (aortic valve replacement)   Acquired hemolytic anemia (HCC)   Mitral valve disease   Chronic anticoagulation   Gastrointestinal hemorrhage   Myocardiopathy (HCC)   Discharge Condition: stable  Diet recommendation: heart healthy  Filed Weights   11/17/24 0500 11/18/24 0500 11/19/24 0500  Weight: 69 kg 71.8 kg 70.6 kg    History of present illness:   59 year old female with HTN, GERD, status post mechanical and aortic valve replacement in September 2025 at [facility], comes into the hospital with weakness, dizziness. This has been going on roughly acutely over the last 3 to 4 days. She has been nauseous during that time. She denies any chest pains. She denies any frank bleeding in her stool but did notice that her stools are darker than normal. In the ED she was found to have Natasha Mcknight hemoglobin of 4.4, fecal occult was positive. She was admitted to the hospital, and GI consulted. Of note, EDP reached out to [facility] cardiothoracic surgery who said that patient could be admitted here and get it 2D echo followed by TEE, and if any issues with the valve to reach back for potential transfer.    TEE deferred here after discussion with  cardiology given lack of clinically significant hemolysis.    EGD with numerous gastric polyps.  16 removed.  Iron  def anemia suspected due to chronic intermittent oozing. Surg path pending at the time of discharge.  GI recommending PPI BID x8 weeks then daily indefinitely, sucralfate  x 4 weeks.  Needs repeat endoscopy in 2-6 months to further reduce polyp burden.  Stable for discharge 1/11.  Discharging with lovenox  bridge.  Hospital Course:  Assessment and Plan:  Symptomatic anemia, positive FOBT Iron  Deficiency patient presented to the hospital with Natasha Mcknight hemoglobin of 4.4.   Haptoglobin normal, bilirubin wnl, LDH is elevated - not suggestive of hemolysis  + Gastroenterology consulted, appreciate input.  Planning for upper and lower endoscopy 1/8. - s/p EGD 1/8 notable for dozens of gastric polyps, 16 removed with partial retrieval, polyps appeared c/w fundic gland polyps - suspect iron  def anemia likely due to chronic intermittent oozing.  Follow pending biopsy.  - colonoscopy 1/8 with diverticulosis in ascending colon - s/p 4 units pRBC - continue to trend - s/p IV iron  and refer for outpatient IV iron  as well - significant iron  def anemia, ferritin 11 with low iron .  Normal B12, folate. - needs repeat endoscopy in 2-6 months to further reduce polyp burden - continue to monitor H/H - maintain active type and screen   History of mitral and aortic mechanical valves -replaced at [facility] in September 2024.  She was placed on warfarin since then.  She was not on anticoagulation prior to that. - INR is 1.2 today, continue warfarin - discharging on lovenox  bridge.  Instructed on getting close follow up with PCP. - appreciate cardiology recommendations   Essential hypertension-continue home metoprolol , blood pressure is stable   CKD 3A Creatinine appears to be lower than recent baseline   Type 2 diabetes mellitus A1c 5.5 Resume home meds   Chronic diastolic CHF  Echo with EF 55-60%,  indeterminate diastolic parameters (notably grade III diastolic dysfunction on echo from 02/2024) Lasix prn outpatient Strict I/O, daily weights   Hyperlipidemia lipitor     Procedures:  Colonoscopy Impression: - Diverticulosis in the ascending colon. - The examined portion of the ileum was normal. - The distal rectum and anal verge  EGD - The examined portions of the nasopharynx, oropharynx and larynx were normal. - Low- grade of narrowing Schatzki ring. - 4 cm hiatal hernia. - Multiple ( dozens) gastric polyps. Sixteen of the largest of these were removed with partial retrieval. These polyps appeared consistent with fundic gland polyps. The patient' s iron  deficiency deficiency is most likely secondary to chronic intermittent oozing from these gastric polyps. - Normal examined duodenum. - Biopsies were taken with Natasha Mcknight cold forceps for Helicobacter pylori testing.  Echo IMPRESSIONS     1. Left ventricular ejection fraction, by estimation, is 55 to 60%. The  left ventricle has normal function. The left ventricle has no regional  wall motion abnormalities. Left ventricular diastolic parameters are  indeterminate.   2. Right ventricular systolic function is normal. The right ventricular  size is mildly enlarged. There is normal pulmonary artery systolic  pressure.   3. Left atrial size was moderately dilated.   4. The mitral valve has been repaired/replaced. No evidence of mitral  valve regurgitation. No evidence of mitral stenosis. There is Natasha Mcknight 23 mm  Regent mechanical valve present in the mitral position. Echo findings are  consistent with normal structure and  function of the mitral valve prosthesis.   5. Tricuspid valve regurgitation is moderate.   6. The aortic valve has been repaired/replaced. Aortic valve  regurgitation is not visualized. No aortic stenosis is present. There is Natasha Mcknight  19 mm Regent mechanical valve present in the aortic position. Procedure  Date: 9/25. Echo findings  are consistent with   normal structure and function of the aortic valve prosthesis.   7. The inferior vena cava is normal in size with greater than 50%  respiratory variability, suggesting right atrial pressure of 3 mmHg.   8. Agitated saline contrast bubble study was negative, with no evidence  of any interatrial shunt.   Conclusion(s)/Recommendation(s): No intracardiac source of embolism  detected on this transthoracic study. Consider Natasha Mcknight transesophageal  echocardiogram to exclude cardiac source of embolism if clinically  indicated.   Consultations: cardiology  Discharge Exam: Vitals:   11/19/24 0949 11/19/24 1135  BP: 135/63 137/71  Pulse: 89 88  Resp:  16  Temp:  97.9 F (36.6 C)  SpO2:  99%   Natasha Mcknight to discharge No complaints - normal BM  General: No acute distress. Cardiovascular: RRR Lungs: unlabored Abdomen: Soft, nontender, nondistended  Neurological: Alert and oriented 3. Moves all extremities 4 with equal strength. Cranial nerves II through XII grossly intact. Extremities: No clubbing or cyanosis. No edema.  Discharge Instructions   Discharge Instructions     Amb Referral to Intravenous Iron  Therapy   Complete by: As directed    You have been referred to [facility] Infusion team for IV Iron  Infusions. The infusion pharmacy team will reach out to you with appointment information.    Primary Diagnosis  Code for IV Iron : D50.9 - Iron  deficiency Anemia   Secondary diagnosis code for IV iron : Other   Comment: GI bleeding   Call MD for:  difficulty breathing, headache or visual disturbances   Complete by: As directed    Call MD for:  extreme fatigue   Complete by: As directed    Call MD for:  hives   Complete by: As directed    Call MD for:  persistant dizziness or light-headedness   Complete by: As directed    Call MD for:  persistant nausea and vomiting   Complete by: As directed    Call MD for:  redness, tenderness, or signs of infection (pain, swelling,  redness, odor or green/yellow discharge around incision site)   Complete by: As directed    Call MD for:  severe uncontrolled pain   Complete by: As directed    Call MD for:  temperature >100.4   Complete by: As directed    Discharge instructions   Complete by: As directed    You were seen for Natasha Mcknight gastrointestinal bleed in the setting of anticoagulation (blood thinners).  You had an endoscopy which was notable for numerous gastric polyps.  The GI doctor removed 16.  You have Natasha Mcknight pending biopsy at the time of discharge.  You'll need to follow up with GI in 2-6 months to further reduce the polyp burden.    Continue protonix  twice Natasha Mcknight day for 8 weeks, then transition to daily indefinitely.  Continue sucralfate  four times daily for 2 weeks to facilitate healing of the plypectomy sites.    I've referred you to get IV iron  as an outpatient.  I've held on starting oral iron  at this time to avoid anything that might cause your stool to be dark with your recent GI bleed, but oral iron  can be considered in the future as an outpatient.   Resume your warfarin as prescribed.  You'll continue lovenox  twice Sidhant Helderman day with this until your INR is at goal.  Follow up with your PCP early this week (Tuesday if possible).  I think you should plan to see them twice this week (Tuesday and Friday if possible) or as recommended by your PCP.  It's extremely important your INR is closely followed and that you continue lovenox  until your INR is at goal.   Return for new, recurrent, or worsening symptoms.  Please ask your PCP to request records from this hospitalization so they know what was done and what the next steps will be.   Increase activity slowly   Complete by: As directed       Allergies as of 11/19/2024       Reactions   Ace Inhibitors Cough, Shortness Of Breath   Vancomycin Anaphylaxis, Other (See Comments)   Other Reaction: severe hypotension   Iodine    Latex         Medication List     STOP taking  these medications    aspirin 81 MG chewable tablet   metoprolol  tartrate 25 MG tablet Commonly known as: LOPRESSOR    omeprazole 20 MG capsule Commonly known as: PRILOSEC       TAKE these medications    atorvastatin  20 MG tablet Commonly known as: LIPITOR Take 20 mg by mouth daily.   enoxaparin  80 MG/0.8ML injection Commonly known as: LOVENOX  Inject 0.7 mLs (70 mg total) into the skin every 12 (twelve) hours.   furosemide 20 MG tablet Commonly known as: LASIX Take 20 mg by mouth  as needed for fluid.   Jardiance 25 MG Tabs tablet Generic drug: empagliflozin Take 10 mg by mouth daily.   metoprolol  succinate 25 MG 24 hr tablet Commonly known as: TOPROL -XL Take 1 tablet (25 mg total) by mouth daily. Start taking on: November 20, 2024   oxyCODONE 5 MG immediate release tablet Commonly known as: Oxy IR/ROXICODONE Take 5 mg by mouth every 6 (six) hours as needed for severe pain (pain score 7-10).   Ozempic (0.25 or 0.5 MG/DOSE) 2 MG/3ML Sopn Generic drug: Semaglutide(0.25 or 0.5MG /DOS) Inject 0.5 mg into the skin once Ronda Kazmi week.   pantoprazole  40 MG tablet Commonly known as: Protonix  Take 1 tablet (40 mg total) by mouth 2 (two) times daily. Transition to once daily indefinitely after 8 weeks.   senna-docusate 8.6-50 MG tablet Commonly known as: Senokot-S Take 2 tablets by mouth daily as needed for mild constipation.   sucralfate  1 g tablet Commonly known as: Carafate  Take 1 tablet (1 g total) by mouth 4 (four) times daily.   TYLENOL  500 MG tablet Generic drug: acetaminophen  Take 500 mg by mouth every 6 (six) hours as needed for moderate pain (pain score 4-6).   warfarin 5 MG tablet Commonly known as: COUMADIN  Take 5 mg by mouth daily.   zolpidem  10 MG tablet Commonly known as: AMBIEN  Take 10 mg by mouth at bedtime as needed.       Allergies[1]    The results of significant diagnostics from this hospitalization (including imaging, microbiology, ancillary  and laboratory) are listed below for reference.    Significant Diagnostic Studies: ECHOCARDIOGRAM COMPLETE BUBBLE STUDY Result Date: 11/13/2024    ECHOCARDIOGRAM REPORT   Patient Name:   Kiala Reindel Date of Exam: 11/13/2024 Medical Rec #:  [id number]          Height:       64.0 in Accession #:    [id number]         Weight:       145.0 lb Date of Birth:  Jun 21, 1966         BSA:          1.706 m Patient Age:    58 years           BP:           121/62 mmHg Patient Gender: F                  HR:           101 bpm. Exam Location:  Inpatient Procedure: 2D Echo, Cardiac Doppler and Color Doppler (Both Spectral and Color            Flow Doppler were utilized during procedure). Indications:    Anemia  History:        Patient has no prior history of Echocardiogram examinations.                 Risk Factors:Hypertension.                 Aortic Valve: 19 mm Regent mechanical valve is present in the                 aortic position. Procedure Date: 9/25.                 Mitral Valve: 23 mm Regent mechanical valve valve is present in                 the mitral position.  Sonographer:  Natasha Mcknight Referring Phys: [id number] JERILYNN GORMAN LATER  Sonographer Comments: Image acquisition challenging due to patient body habitus. IMPRESSIONS  1. Left ventricular ejection fraction, by estimation, is 55 to 60%. The left ventricle has normal function. The left ventricle has no regional wall motion abnormalities. Left ventricular diastolic parameters are indeterminate.  2. Right ventricular systolic function is normal. The right ventricular size is mildly enlarged. There is normal pulmonary artery systolic pressure.  3. Left atrial size was moderately dilated.  4. The mitral valve has been repaired/replaced. No evidence of mitral valve regurgitation. No evidence of mitral stenosis. There is Natasha Mcknight 23 mm Regent mechanical valve present in the mitral position. Echo findings are consistent with normal structure and function of the mitral valve  prosthesis.  5. Tricuspid valve regurgitation is moderate.  6. The aortic valve has been repaired/replaced. Aortic valve regurgitation is not visualized. No aortic stenosis is present. There is Natasha Mcknight 19 mm Regent mechanical valve present in the aortic position. Procedure Date: 9/25. Echo findings are consistent with  normal structure and function of the aortic valve prosthesis.  7. The inferior vena cava is normal in size with greater than 50% respiratory variability, suggesting right atrial pressure of 3 mmHg.  8. Agitated saline contrast bubble study was negative, with no evidence of any interatrial shunt. Conclusion(s)/Recommendation(s): No intracardiac source of embolism detected on this transthoracic study. Consider Carron Jaggi transesophageal echocardiogram to exclude cardiac source of embolism if clinically indicated. FINDINGS  Left Ventricle: Left ventricular ejection fraction, by estimation, is 55 to 60%. The left ventricle has normal function. The left ventricle has no regional wall motion abnormalities. The left ventricular internal cavity size was normal in size. There is  no left ventricular hypertrophy. Left ventricular diastolic parameters are indeterminate. Right Ventricle: The right ventricular size is mildly enlarged. No increase in right ventricular wall thickness. Right ventricular systolic function is normal. There is normal pulmonary artery systolic pressure. The tricuspid regurgitant velocity is 2.84  m/s, and with an assumed right atrial pressure of 3 mmHg, the estimated right ventricular systolic pressure is 35.3 mmHg. Left Atrium: Left atrial size was moderately dilated. Right Atrium: Right atrial size was normal in size. Pericardium: There is no evidence of pericardial effusion. Mitral Valve: The mitral valve has been repaired/replaced. No evidence of mitral valve regurgitation. There is Natasha Mcknight 23 mm Regent mechanical valve present in the mitral position. Echo findings are consistent with normal structure  and function of the mitral valve prosthesis. No evidence of mitral valve stenosis. MV peak gradient, 19.7 mmHg. The mean mitral valve gradient is 11.0 mmHg. Tricuspid Valve: The tricuspid valve is normal in structure. Tricuspid valve regurgitation is moderate . No evidence of tricuspid stenosis. Aortic Valve: The aortic valve has been repaired/replaced. Aortic valve regurgitation is not visualized. No aortic stenosis is present. Aortic valve mean gradient measures 12.0 mmHg. Aortic valve peak gradient measures 21.7 mmHg. Aortic valve area, by VTI measures 0.65 cm. There is Natasha Mcknight 19 mm Regent mechanical valve present in the aortic position. Procedure Date: 9/25. Echo findings are consistent with normal structure and function of the aortic valve prosthesis. Pulmonic Valve: The pulmonic valve was normal in structure. Pulmonic valve regurgitation is not visualized. No evidence of pulmonic stenosis. Aorta: The aortic root is normal in size and structure. Venous: The inferior vena cava is normal in size with greater than 50% respiratory variability, suggesting right atrial pressure of 3 mmHg. IAS/Shunts: No atrial level shunt detected by color flow Doppler. Agitated saline contrast  was given intravenously to evaluate for intracardiac shunting. Agitated saline contrast bubble study was negative, with no evidence of any interatrial shunt.  LEFT VENTRICLE PLAX 2D LVIDd:         4.60 cm LVIDs:         2.80 cm LV PW:         0.80 cm LV IVS:        0.80 cm LVOT diam:     1.50 cm LV SV:         28 LV SV Index:   16 LVOT Area:     1.77 cm  RIGHT VENTRICLE             IVC RV Basal diam:  4.30 cm     IVC diam: 1.70 cm RV Mid diam:    3.60 cm RV S prime:     10.40 cm/s TAPSE (M-mode): 1.1 cm LEFT ATRIUM             Index        RIGHT ATRIUM           Index LA diam:        4.30 cm 2.52 cm/m   RA Area:     24.60 cm LA Vol (A2C):   77.8 ml 45.59 ml/m  RA Volume:   88.60 ml  51.92 ml/m LA Vol (A4C):   73.5 ml 43.07 ml/m LA Biplane  Vol: 78.6 ml 46.06 ml/m  AORTIC VALVE AV Area (Vmax):    0.65 cm AV Area (Vmean):   0.66 cm AV Area (VTI):     0.65 cm AV Vmax:           233.00 cm/s AV Vmean:          166.000 cm/s AV VTI:            0.430 m AV Peak Grad:      21.7 mmHg AV Mean Grad:      12.0 mmHg LVOT Vmax:         85.20 cm/s LVOT Vmean:        61.900 cm/s LVOT VTI:          0.159 m LVOT/AV VTI ratio: 0.37  AORTA Ao Root diam: 3.00 cm Ao Asc diam:  3.40 cm MITRAL VALVE              TRICUSPID VALVE MV Area VTI:  0.68 cm    TR Peak grad:   32.3 mmHg MV Peak grad: 19.7 mmHg   TR Vmax:        284.00 cm/s MV Mean grad: 11.0 mmHg MV Vmax:      2.22 m/s    SHUNTS MV Vmean:     154.0 cm/s  Systemic VTI:  0.16 m                           Systemic Diam: 1.50 cm Natasha Parchment MD Electronically signed by Natasha Parchment MD Signature Date/Time: 11/13/2024/4:38:42 PM    Final    CT Head Wo Contrast Result Date: 11/12/2024 EXAM: CT HEAD WITHOUT 11/12/2024 10:27:43 AM TECHNIQUE: CT of the head was performed without the administration of intravenous contrast. Automated exposure control, iterative reconstruction, and/or weight based adjustment of the mA/kV was utilized to reduce the radiation dose to as low as reasonably achievable. COMPARISON: None available. CLINICAL HISTORY: Vertigo, peripheral. FINDINGS: BRAIN AND VENTRICLES: No acute intracranial hemorrhage. No mass effect or midline shift. No extra-axial  fluid collection. No evidence of acute infarct. No hydrocephalus. Atherosclerotic calcifications are present within the cavernous internal carotid arteries. ORBITS: No acute abnormality. SINUSES AND MASTOIDS: No acute abnormality. SOFT TISSUES AND SKULL: No acute skull fracture. No acute soft tissue abnormality. IMPRESSION: 1. No acute intracranial abnormality. Electronically signed by: Natasha Naveau MD 11/12/2024 10:32 AM EST RP Workstation: [id number]    Microbiology: No results found for this or any previous visit (from the past 240 hours).    Labs: Basic Metabolic Panel: Recent Labs  Lab 11/13/24 0431 11/15/24 1158 11/16/24 0225 11/17/24 0209 11/18/24 0143  NA 139 136 138 136 135  K 3.9 4.3 4.5 4.1 4.1  CL 105 105 106 105 104  CO2 25 24 22 23 22   GLUCOSE 107* 98 113* 141* 130*  BUN 23* 18 13 14 11   CREATININE 1.14* 1.11* 1.12* 1.06* 1.02*  CALCIUM  8.6* 8.5* 8.5* 8.2* 8.4*  MG  --   --  1.9  --  1.6*  PHOS  --   --  1.9*  --  1.9*   Liver Function Tests: Recent Labs  Lab 11/13/24 0431 11/16/24 0225  AST 20 37  ALT 17 27  ALKPHOS 68 81  BILITOT 0.7 0.9  PROT 5.9* 6.2*  ALBUMIN 3.8 3.8   No results for input(s): LIPASE, AMYLASE in the last 168 hours. No results for input(s): AMMONIA in the last 168 hours. CBC: Recent Labs  Lab 11/13/24 0650 11/13/24 1308 11/16/24 0225 11/17/24 0209 11/17/24 1023 11/18/24 0143 11/18/24 1154 11/19/24 0145  WBC 9.0   < > 8.3 7.7  --  8.6 8.4 8.7  NEUTROABS 6.2  --   --   --   --   --   --   --   HGB 7.0*   < > 8.0* 7.3* 7.2* 8.4* 8.8* 8.3*  HCT 21.7*   < > 24.1* 22.3* 22.2* 25.9* 26.6* 24.7*  MCV 84.4   < > 84.3 85.1  --  85.8 84.2 84.6  PLT 223   < > 239 202  --  215 218 210   < > = values in this interval not displayed.   Cardiac Enzymes: No results for input(s): CKTOTAL, CKMB, CKMBINDEX, TROPONINI in the last 168 hours. BNP: BNP (last 3 results) No results for input(s): BNP in the last 8760 hours.  ProBNP (last 3 results) No results for input(s): PROBNP in the last 8760 hours.  CBG: No results for input(s): GLUCAP in the last 168 hours.     Signed:  Meliton Monte MD.  [facility] 11/19/2024, 1:26 PM       [1]  Allergies Allergen Reactions   Ace Inhibitors Cough and Shortness Of Breath   Vancomycin Anaphylaxis and Other (See Comments)    Other Reaction: severe hypotension   Iodine    Latex

## 2024-11-20 ENCOUNTER — Other Ambulatory Visit (HOSPITAL_COMMUNITY): Payer: Self-pay

## 2024-11-20 ENCOUNTER — Encounter (HOSPITAL_COMMUNITY)

## 2024-11-21 LAB — SURGICAL PATHOLOGY

## 2024-11-21 NOTE — Telephone Encounter (Signed)
 Erroneous encounter

## 2024-11-22 ENCOUNTER — Encounter (HOSPITAL_COMMUNITY)

## 2024-11-23 ENCOUNTER — Telehealth (HOSPITAL_COMMUNITY): Payer: Self-pay

## 2024-11-23 NOTE — Telephone Encounter (Signed)
 Welfare check in light of pt's recent hospitalization and 11/19/24 discharge.  Left message enquiring if pt had followed up with her PCP and/or had made an appointment with her PCP as per her hospital discharge note.  Educated patient that she would need medical clearance to return to cardiac rehab.

## 2024-11-24 ENCOUNTER — Encounter (HOSPITAL_COMMUNITY)

## 2024-11-24 NOTE — Telephone Encounter (Signed)
 Pt returned phone call today 11/24/24 and informed me that she will be seeing her PCP this coming Tuesday 11/28/24 and hopes to get clearance to return to cardiac rehab at that time.  Pt's appointment for Monday cancelled.  Pt would also like to extend to make up for missed cardiac rehab sessions.

## 2024-11-25 ENCOUNTER — Ambulatory Visit: Payer: Self-pay | Admitting: Gastroenterology

## 2024-11-25 DIAGNOSIS — K317 Polyp of stomach and duodenum: Secondary | ICD-10-CM

## 2024-11-27 ENCOUNTER — Encounter (HOSPITAL_COMMUNITY)

## 2024-11-27 NOTE — Progress Notes (Signed)
 Ms. 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 These types of polyps are typically secondary to acid suppression therapy and typically no specific follow-up is required for these polyps.  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   Given this finding, as well as the vast number and size of the polyps in your stomach, I recommend you undergo genetic testing to make sure you do not have a polyposis syndrome that places you at increased risk of gastrointestinal cancer. We will place a referral for genetic testing and you will be contacted to schedule an appointment.  As you know, you still have many other polyps in your stomach and I do recommend we repeat an upper endoscopy in the next few months to remove more of the largest ones.  Given your mechanical heart valves, managing your anticoagulation will be tricky.  Typically this is done with a lovenox  bridge while your coumadin  is held.  We will reach out to your cardiology team to determine if this felt to be reasonable.  If not, we may need to admit you to the hospital to keep you on a heparin  drip while you are off coumadin .  Team,  Please contact patient's cardiologist at [facility] (Dr. Toribio Ada) for guidance on management of anticoagulation for repeat EGD with removal of numerous large gastric polyps. If okay with lovenox  bridge, plan for repeat EGD in the hospital in March. Please place genetic referral to evaluate for polyposis syndrome (gastric polyps)

## 2024-11-28 ENCOUNTER — Ambulatory Visit (HOSPITAL_COMMUNITY)
Admission: RE | Admit: 2024-11-28 | Discharge: 2024-11-28 | Disposition: A | Discharge status: Home / Self Care | Source: Ambulatory Visit | Attending: Family Medicine | Admitting: Family Medicine

## 2024-11-28 ENCOUNTER — Encounter (HOSPITAL_COMMUNITY): Payer: Self-pay

## 2024-11-28 ENCOUNTER — Other Ambulatory Visit: Payer: Self-pay

## 2024-11-28 ENCOUNTER — Emergency Department (HOSPITAL_COMMUNITY)
Admission: EM | Admit: 2024-11-28 | Discharge: 2024-11-29 | Disposition: A | Discharge status: Home / Self Care | Attending: Emergency Medicine | Admitting: Emergency Medicine

## 2024-11-28 VITALS — BP 122/60 | HR 93 | Temp 97.8°F | Resp 16

## 2024-11-28 DIAGNOSIS — Z7984 Long term (current) use of oral hypoglycemic drugs: Secondary | ICD-10-CM | POA: Insufficient documentation

## 2024-11-28 DIAGNOSIS — D649 Anemia, unspecified: Secondary | ICD-10-CM | POA: Insufficient documentation

## 2024-11-28 DIAGNOSIS — Z91040 Latex allergy status: Secondary | ICD-10-CM | POA: Insufficient documentation

## 2024-11-28 DIAGNOSIS — Z7901 Long term (current) use of anticoagulants: Secondary | ICD-10-CM | POA: Insufficient documentation

## 2024-11-28 LAB — BASIC METABOLIC PANEL WITH GFR
Anion gap: 10 (ref 5–15)
BUN: 12 mg/dL (ref 6–20)
CO2: 25 mmol/L (ref 22–32)
Calcium: 9.4 mg/dL (ref 8.9–10.3)
Chloride: 102 mmol/L (ref 98–111)
Creatinine, Ser: 1.18 mg/dL — ABNORMAL HIGH (ref 0.44–1.00)
GFR, Estimated: 53 mL/min — ABNORMAL LOW
Glucose, Bld: 115 mg/dL — ABNORMAL HIGH (ref 70–99)
Potassium: 4.2 mmol/L (ref 3.5–5.1)
Sodium: 137 mmol/L (ref 135–145)

## 2024-11-28 LAB — CBC WITH DIFFERENTIAL/PLATELET
Abs Immature Granulocytes: 0.06 K/uL (ref 0.00–0.07)
Basophils Absolute: 0 K/uL (ref 0.0–0.1)
Basophils Relative: 0 %
Eosinophils Absolute: 0.1 K/uL (ref 0.0–0.5)
Eosinophils Relative: 1 %
HCT: 24.3 % — ABNORMAL LOW (ref 36.0–46.0)
Hemoglobin: 7.3 g/dL — ABNORMAL LOW (ref 12.0–15.0)
Immature Granulocytes: 1 %
Lymphocytes Relative: 19 %
Lymphs Abs: 1.5 K/uL (ref 0.7–4.0)
MCH: 26.8 pg (ref 26.0–34.0)
MCHC: 30 g/dL (ref 30.0–36.0)
MCV: 89.3 fL (ref 80.0–100.0)
Monocytes Absolute: 0.6 K/uL (ref 0.1–1.0)
Monocytes Relative: 8 %
Neutro Abs: 5.7 K/uL (ref 1.7–7.7)
Neutrophils Relative %: 71 %
Platelets: 302 K/uL (ref 150–400)
RBC: 2.72 MIL/uL — ABNORMAL LOW (ref 3.87–5.11)
RDW: 17.4 % — ABNORMAL HIGH (ref 11.5–15.5)
WBC: 8 K/uL (ref 4.0–10.5)
nRBC: 0.4 % — ABNORMAL HIGH (ref 0.0–0.2)

## 2024-11-28 LAB — PROTIME-INR
INR: 2.7 — ABNORMAL HIGH (ref 0.8–1.2)
Prothrombin Time: 30.2 s — ABNORMAL HIGH (ref 11.4–15.2)

## 2024-11-28 LAB — PREPARE RBC (CROSSMATCH)

## 2024-11-28 MED ORDER — SODIUM CHLORIDE 0.9% IV SOLUTION: Freq: Once | INTRAVENOUS | Status: AC

## 2024-11-28 MED ORDER — IRON SUCROSE 200 MG IVPB - SIMPLE MED
Status: AC
  Filled 2024-11-28: qty 110

## 2024-11-28 MED ORDER — PANTOPRAZOLE SODIUM 40 MG IV SOLR
80.0000 mg | Freq: Once | INTRAVENOUS | Status: AC
  Administered 2024-11-29: 80 mg via INTRAVENOUS
  Filled 2024-11-28: qty 20

## 2024-11-28 MED ORDER — IRON SUCROSE 200 MG IVPB - SIMPLE MED
200.0000 mg | Freq: Once | Status: AC
  Administered 2024-11-28: 200 mg via INTRAVENOUS

## 2024-11-28 NOTE — ED Provider Notes (Addendum)
 " [place] EMERGENCY DEPARTMENT AT [facility] Provider Note   CSN: [id number] Arrival date & time: 11/28/24  1821     Patient presents with: No chief complaint on file.   Natasha Mcknight is a 59 y.o. female.With a history of mitral valve replacement, aortic valve replacement and acquired hemolytic anemia who presents to the ED for anemia.  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  She had outpatient labs drawn from her PCP which showed concern for hemoglobin of 6.7.  Patient herself is asymptomatic.  No significant weakness lightheadedness shortness of breath and no reported GI bleeding.  Compliant with pantoprazole  p.o.  She is scheduled for another EGD with GI in March   HPI     Prior to Admission medications  Medication Sig Start Date End Date Taking? Authorizing Provider  acetaminophen  (TYLENOL ) 500 MG tablet Take 500 mg by mouth every 6 (six) hours as needed for moderate pain (pain score 4-6).    [provider]  atorvastatin  (LIPITOR) 20 MG tablet Take 20 mg by mouth daily. 12/24/21   [provider]  enoxaparin  (LOVENOX ) 80 MG/0.8ML injection Inject 0.7 mLs (70 mg total) into the skin every 12 (twelve) hours. 11/19/24 11/28/24  Perri DELENA Meliton Mickey., MD  furosemide (LASIX) 20 MG tablet Take 20 mg by mouth as needed for fluid. 09/29/24   [provider]  JARDIANCE 25 MG TABS tablet Take 10 mg by mouth daily.    [provider]  metoprolol  succinate (TOPROL -XL) 25 MG 24 hr tablet Take 1 tablet (25 mg total) by mouth daily. 11/20/24 12/20/24  Perri DELENA Meliton Mickey., MD  oxyCODONE (OXY IR/ROXICODONE) 5 MG immediate release tablet Take 5 mg by mouth every 6 (six) hours as needed for severe pain (pain score 7-10).    [provider]  OZEMPIC, 0.25 OR 0.5 MG/DOSE, 2 MG/3ML SOPN Inject 0.5 mg into the skin once a week. 07/23/23   [provider]   pantoprazole  (PROTONIX ) 40 MG tablet Take 1 tablet (40 mg total) by mouth 2 (two) times daily. Transition to once daily indefinitely after 8 weeks. 11/19/24 01/19/25  Perri DELENA Meliton Mickey., MD  senna-docusate (SENOKOT-S) 8.6-50 MG tablet Take 2 tablets by mouth daily as needed for mild constipation.    [provider]  sucralfate  (CARAFATE ) 1 g tablet Take 1 tablet (1 g total) by mouth 4 (four) times daily. 11/19/24 12/04/24  Perri DELENA Meliton Mickey., MD  warfarin (COUMADIN ) 5 MG tablet Take 5 mg by mouth daily.    [provider]  zolpidem  (AMBIEN ) 10 MG tablet Take 10 mg by mouth at bedtime as needed.    [provider]    Allergies: Ace inhibitors, Vancomycin, Iodine, and Latex    Review of Systems  Updated Vital Signs BP 122/68   Pulse (!) 103   Temp 98.6 F (37 C) (Oral)   Resp (!) 22   Ht 5' 4 (1.626 m)   Wt 66.2 kg   SpO2 100%   BMI 25.06 kg/m   Physical Exam Vitals and nursing note reviewed.  HENT:     Head: Normocephalic and atraumatic.  Eyes:     Pupils: Pupils are equal, round, and reactive to light.  Cardiovascular:     Rate and Rhythm: Normal rate and regular rhythm.  Pulmonary:     Effort: Pulmonary effort is normal.     Breath sounds: Normal  breath sounds.  Abdominal:     Palpations: Abdomen is soft.     Tenderness: There is no abdominal tenderness.  Skin:    General: Skin is warm and dry.  Neurological:     Mental Status: She is alert.  Psychiatric:        Mood and Affect: Mood normal.     (all labs ordered are listed, but only abnormal results are displayed) Labs Reviewed  CBC WITH DIFFERENTIAL/PLATELET - Abnormal; Notable for the following components:      Result Value   RBC 2.72 (*)    Hemoglobin 7.3 (*)    HCT 24.3 (*)    RDW 17.4 (*)    nRBC 0.4 (*)    All other components within normal limits  BASIC METABOLIC PANEL WITH GFR - Abnormal; Notable for the following components:   Glucose, Bld 115 (*)    Creatinine,  Ser 1.18 (*)    GFR, Estimated 53 (*)    All other components within normal limits  PROTIME-INR - Abnormal; Notable for the following components:   Prothrombin Time 30.2 (*)    INR 2.7 (*)    All other components within normal limits  PREPARE RBC (CROSSMATCH)  TYPE AND SCREEN    EKG: None  Radiology: No results found.   .Critical Care  Performed by: Pamella Ozell LABOR, DO Authorized by: Pamella Ozell LABOR, DO   Critical care provider statement:    Critical care time (minutes):  45   Critical care was necessary to treat or prevent imminent or life-threatening deterioration of the following conditions:  Circulatory failure   Critical care was time spent personally by me on the following activities:  Development of treatment plan with patient or surrogate, discussions with consultants, evaluation of patient's response to treatment, examination of patient, ordering and review of laboratory studies, ordering and review of radiographic studies, ordering and performing treatments and interventions, pulse oximetry, re-evaluation of patient's condition, review of old charts and obtaining history from patient or surrogate   I assumed direction of critical care for this patient from another provider in my specialty: no      Medications Ordered in the ED  0.9 %  sodium chloride  infusion (Manually program via Guardrails IV Fluids) (has no administration in time range)  pantoprazole  (PROTONIX ) injection 80 mg (has no administration in time range)    Clinical Course as of 11/28/24 2337  Tue Nov 28, 2024  2337 Blood pressure remained stable.  Patient asymptomatic.  RBC transfusion underway plan for discharge thereafter  I, Ozell Pamella DO, am transitioning care of this patient to the oncoming provider pending disposition after completion of transfusion [MP]    Clinical Course User Index [MP] Pamella Ozell LABOR, DO                                 Medical Decision Making 59 year old female  with history as above including recent EGD which showed fundal polyps presenting for recurrent anemia.  Patient herself is asymptomatic well-appearing.  Slightly tachycardic, initial assessment no reported lightheadedness shortness of breath.  Type and screen has been completed we will transfuse 1 unit of blood which should achieve hemoglobin of around 8.  Will also administer Protonix .  No active bleeding reported.  Discussed with patient and she does have PCP follow-up in 1 week.  Will have her hemoglobin rechecked and follow-up with GI as previously was scheduled.  No need  for admission tonight but discussed strict return precautions should symptoms of anemia worsen or she have acute GI bleeding.  Amount and/or Complexity of Data Reviewed Labs: ordered.  Risk Prescription drug management.        Final diagnoses:  Anemia, unspecified type    ED Discharge Orders     None          Pamella Ozell LABOR, DO 11/28/24 2314    Pamella Ozell LABOR, DO 11/28/24 2337  "

## 2024-11-28 NOTE — Discharge Instructions (Addendum)
 You were seen in the emerged department for low hemoglobin (anemia) we gave you 1 unit of blood to raise your hemoglobin You need to follow-up with your GI team and have your hemoglobin rechecked Return to the emergency department for symptoms of weakness lightheadedness shortness of breath or if you have any evidence of GI bleeding Continue taking all previously prescribed medications and discussed the results of your recent endoscopy with your GI team

## 2024-11-28 NOTE — ED Provider Triage Note (Signed)
 Emergency Medicine Provider Triage Evaluation Note  Kevionna Heffler , a 59 y.o. female  was evaluated in triage.  Pt complains of anemia.  Pt sent  here for a blood transfusion   Review of Systems  Positive: Bleeding polyps Negative: weakness  Physical Exam  BP (!) 152/80 (BP Location: Right Arm)   Pulse (!) 111   Temp 98.2 F (36.8 C)   Resp 12   Ht 5' 4 (1.626 m)   Wt 66.2 kg   SpO2 99%   BMI 25.06 kg/m  Gen:   Awake, no distress   Resp:  Normal effort  MSK:   Moves extremities without difficulty  Other:    Medical Decision Making  Medically screening exam initiated at 7:02 PM.  Appropriate orders placed.  Tyia Binford was informed that the remainder of the evaluation will be completed by another provider, this initial triage assessment does not replace that evaluation, and the importance of remaining in the ED until their evaluation is complete.     Flint Sonny POUR, PA-C 11/28/24 1902

## 2024-11-28 NOTE — ED Triage Notes (Signed)
 Pt was sent by PCP for hemoglobin 6.7. Pt denies dizziness, weakness, or SOB

## 2024-11-29 ENCOUNTER — Encounter (HOSPITAL_COMMUNITY)

## 2024-11-29 LAB — BPAM RBC
Blood Product Expiration Date: 202602142359
Blood Product Expiration Date: 202602152359
ISSUE DATE / TIME: 202601202100
Unit Type and Rh: 5100
Unit Type and Rh: 5100

## 2024-11-29 LAB — TYPE AND SCREEN
ABO/RH(D): O POS
Antibody Screen: NEGATIVE
Unit division: 0
Unit division: 0

## 2024-11-29 NOTE — ED Provider Notes (Signed)
 I assumed care of patient at signout. Patient has tolerated blood transfusion. She denies any complaints.  No reactions noted Patient denies any recent bloody stool, no vomiting, no vaginal bleeding or hematuria Patient will be referred to hematology due to chronic anemia Patient safe for discharge   Midge Golas, MD 11/29/24 0040

## 2024-11-30 ENCOUNTER — Ambulatory Visit (HOSPITAL_COMMUNITY)
Admission: RE | Admit: 2024-11-30 | Discharge: 2024-11-30 | Disposition: A | Discharge status: Home / Self Care | Source: Ambulatory Visit | Attending: Family Medicine | Admitting: Family Medicine

## 2024-11-30 VITALS — BP 151/85 | HR 98 | Temp 97.7°F | Resp 17

## 2024-11-30 DIAGNOSIS — D649 Anemia, unspecified: Secondary | ICD-10-CM

## 2024-11-30 MED ORDER — IRON SUCROSE 200 MG IVPB - SIMPLE MED
Status: AC
  Filled 2024-11-30: qty 110

## 2024-11-30 MED ORDER — IRON SUCROSE 200 MG IVPB - SIMPLE MED
200.0000 mg | Freq: Once | Status: AC
  Administered 2024-11-30: 200 mg via INTRAVENOUS

## 2024-12-01 ENCOUNTER — Encounter (HOSPITAL_COMMUNITY)

## 2024-12-01 NOTE — Addendum Note (Signed)
 Encounter addended by: Janann Lenis on: 12/01/2024 4:02 PM  Actions taken: Flowsheet data copied forward, Flowsheet accepted

## 2024-12-04 ENCOUNTER — Encounter (HOSPITAL_COMMUNITY)

## 2024-12-05 ENCOUNTER — Inpatient Hospital Stay (HOSPITAL_COMMUNITY): Admission: RE | Admit: 2024-12-05 | Source: Ambulatory Visit

## 2024-12-05 ENCOUNTER — Encounter (HOSPITAL_COMMUNITY): Payer: Self-pay

## 2024-12-05 NOTE — Progress Notes (Signed)
 Cardiac Individual Treatment Plan  Patient Details  Name: Natasha Mcknight MRN: [id number] Date of Birth: 05/12/1966 Referring Provider:   Flowsheet Row INTENSIVE CARDIAC REHAB ORIENT from 10/12/2024 in [facility]  Referring Provider Dr. Emeline (Wilbert Bihari, MD covering)    Initial Encounter Date:  Flowsheet Row INTENSIVE CARDIAC REHAB ORIENT from 10/12/2024 in [facility]  Date 10/12/24    Visit Diagnosis: No diagnosis found.  Patient's Home Medications on Admission: Current Medications[1]  Past Medical History: Past Medical History:  Diagnosis Date   Anemia    Clotting disorder    Endometriosis    Fibroids    GERD (gastroesophageal reflux disease)    Murmur, cardiac     Tobacco Use: Tobacco Use History[2]  Labs: Review Flowsheet       Latest Ref Rng & Units 11/15/2024  Labs for ITP Cardiac and Pulmonary Rehab  Hemoglobin A1c 4.8 - 5.6 % 5.5     Capillary Blood Glucose: Lab Results  Component Value Date   GLUCAP 103 (H) 10/18/2024   GLUCAP 74 10/18/2024   GLUCAP 138 (H) 10/18/2024   GLUCAP 87 10/12/2024     Exercise Target Goals: Exercise Program Goal: Individual exercise prescription set using results from initial 6 min walk test and THRR while considering  patients activity barriers and safety.   Exercise Prescription Goal: Initial exercise prescription builds to 30-45 minutes a day of aerobic activity, 2-3 days per week.  Home exercise guidelines will be given to patient during program as part of exercise prescription that the participant will acknowledge.  Activity Barriers & Risk Stratification:  Activity Barriers & Cardiac Risk Stratification - 10/12/24 1647       Activity Barriers & Cardiac Risk Stratification   Activity Barriers Deconditioning;Back Problems    Cardiac Risk Stratification High   Under 5 MEt's on walk test          6 Minute Walk:  6 Minute Walk     Row Name 10/12/24 1636         6 Minute Walk   Phase Initial     Distance 1155 feet     Walk Time 6 minutes     # of Rest Breaks 0     MPH 2.19     METS 3.53     RPE 9     Perceived Dyspnea  0     VO2 Peak 12.34     Symptoms Yes (comment)     Comments last lap 1 second stink feeling pain in chest. Resolved instant, none additional. She says maybe 1/10 pain just enough to notice. Nurse aware. No over symptom     Resting HR 97 bpm     Resting BP 138/72     Resting Oxygen Saturation  99 %     Exercise Oxygen Saturation  during 6 min walk 100 %     Max Ex. HR 109 bpm     Max Ex. BP 144/80     2 Minute Post BP 138/80        Oxygen Initial Assessment:   Oxygen Re-Evaluation:   Oxygen Discharge (Final Oxygen Re-Evaluation):   Initial Exercise Prescription:  Initial Exercise Prescription - 10/12/24 1300       Date of Initial Exercise RX and Referring Provider   Date 10/12/24    Referring Provider Dr. Emeline (Wilbert Bihari, MD covering)    Expected Discharge  Date 01/03/25      Treadmill   MPH 2.4    Grade 0    Minutes 15    METs 2.84      NuStep   Level 1    SPM 70    Minutes 1.8      Prescription Details   Frequency (times per week) 3    Duration Progress to 30 minutes of continuous aerobic without signs/symptoms of physical distress      Intensity   THRR 40-80% of Max Heartrate 65-130    Ratings of Perceived Exertion 11-13    Perceived Dyspnea 0-4      Progression   Progression Continue progressive overload as per policy without signs/symptoms or physical distress.      Resistance Training   Training Prescription Yes    Weight 2    Reps 10-15          Perform Capillary Blood Glucose checks as needed.  Exercise Prescription Changes:   Exercise Prescription Changes     Row Name 10/16/24 1400 10/30/24 1500           Response to Exercise   Blood Pressure (Admit) 124/64 120/58      Blood Pressure  (Exercise) 130/70 118/70      Blood Pressure (Exit) 130/74 110/52      Heart Rate (Admit) 101 bpm 105 bpm      Heart Rate (Exercise) 122 bpm 106 bpm      Heart Rate (Exit) 101 bpm 99 bpm      Rating of Perceived Exertion (Exercise) 11 11      Symptoms None None      Comments Pt's first day in the CRP2 program Reviewed METs      Duration Continue with 30 min of aerobic exercise without signs/symptoms of physical distress. Continue with 30 min of aerobic exercise without signs/symptoms of physical distress.      Intensity THRR unchanged THRR unchanged        Progression   Progression Continue to progress workloads to maintain intensity without signs/symptoms of physical distress. Continue to progress workloads to maintain intensity without signs/symptoms of physical distress.      Average METs 2.25 2.35        Resistance Training   Training Prescription Yes --      Weight 2 lbs 2 lbs      Reps 10-15 10-15      Time 5 Minutes 5 Minutes        Interval Training   Interval Training No No        Treadmill   MPH 2.2 2.2      Grade 0 0      Minutes 15 15      METs 2.69 2.69        NuStep   Level 1 1      SPM 81 103      Minutes 15 15      METs 1.8 2         Exercise Comments:   Exercise Comments     Row Name 10/16/24 1423 10/30/24 1549 11/15/24 1548 12/01/24 1557     Exercise Comments Pt's first day in the CRP2 program; pt exercised without complaints. Reviewed METs with patient today. METs are steady at 2.7. Will increase workload on treadmill next session. Pt due for METs and goals review but did not attend as pt is hospitalized. Pt due for METs and goals review but did not attend as pt  was hospitalized. Waitng for clearance from MD to return the CRP2 program.       Exercise Goals and Review:   Exercise Goals     Row Name 10/12/24 1325             Exercise Goals   Increase Physical Activity Yes       Intervention Provide advice, education, support and counseling  about physical activity/exercise needs.;Develop an individualized exercise prescription for aerobic and resistive training based on initial evaluation findings, risk stratification, comorbidities and participant's personal goals.       Expected Outcomes Short Term: Attend rehab on a regular basis to increase amount of physical activity.;Long Term: Exercising regularly at least 3-5 days a week.;Long Term: Add in home exercise to make exercise part of routine and to increase amount of physical activity.       Increase Strength and Stamina Yes       Intervention Provide advice, education, support and counseling about physical activity/exercise needs.;Develop an individualized exercise prescription for aerobic and resistive training based on initial evaluation findings, risk stratification, comorbidities and participant's personal goals.       Expected Outcomes Short Term: Increase workloads from initial exercise prescription for resistance, speed, and METs.;Short Term: Perform resistance training exercises routinely during rehab and add in resistance training at home;Long Term: Improve cardiorespiratory fitness, muscular endurance and strength as measured by increased METs and functional capacity ( )       Able to understand and use rate of perceived exertion (RPE) scale Yes       Intervention Provide education and explanation on how to use RPE scale       Expected Outcomes Short Term: Able to use RPE daily in rehab to express subjective intensity level;Long Term:  Able to use RPE to guide intensity level when exercising independently       Knowledge and understanding of Target Heart Rate Range (THRR) Yes       Intervention Provide education and explanation of THRR including how the numbers were predicted and where they are located for reference       Expected Outcomes Short Term: Able to state/look up THRR;Long Term: Able to use THRR to govern intensity when exercising independently;Short Term: Able to  use daily as guideline for intensity in rehab       Understanding of Exercise Prescription Yes       Intervention Provide education, explanation, and written materials on patient's individual exercise prescription       Expected Outcomes Short Term: Able to explain program exercise prescription;Long Term: Able to explain home exercise prescription to exercise independently          Exercise Goals Re-Evaluation :  Exercise Goals Re-Evaluation     Row Name 10/16/24 1422             Exercise Goal Re-Evaluation   Exercise Goals Review Increase Physical Activity;Understanding of Exercise Prescription;Knowledge and understanding of Target Heart Rate Range (THRR);Increase Strength and Stamina;Able to understand and use rate of perceived exertion (RPE) scale       Comments Pt's first day in the CRP2 program. Pt understands the exercie Rx, RPE scale, and THRR.       Expected Outcomes Will continue to monitor the patient and progress exercise workloads as tolerated.          Discharge Exercise Prescription (Final Exercise Prescription Changes):  Exercise Prescription Changes - 10/30/24 1500       Response to Exercise   Blood Pressure (  Admit) 120/58    Blood Pressure (Exercise) 118/70    Blood Pressure (Exit) 110/52    Heart Rate (Admit) 105 bpm    Heart Rate (Exercise) 106 bpm    Heart Rate (Exit) 99 bpm    Rating of Perceived Exertion (Exercise) 11    Symptoms None    Comments Reviewed METs    Duration Continue with 30 min of aerobic exercise without signs/symptoms of physical distress.    Intensity THRR unchanged      Progression   Progression Continue to progress workloads to maintain intensity without signs/symptoms of physical distress.    Average METs 2.35      Resistance Training   Weight 2 lbs    Reps 10-15    Time 5 Minutes      Interval Training   Interval Training No      Treadmill   MPH 2.2    Grade 0    Minutes 15    METs 2.69      NuStep   Level 1     SPM 103    Minutes 15    METs 2          Nutrition:  Target Goals: Understanding of nutrition guidelines, daily intake of sodium 1500mg , cholesterol 200mg , calories 30% from fat and 7% or less from saturated fats, daily to have 5 or more servings of fruits and vegetables.  Biometrics:  Pre Biometrics - 10/12/24 1649       Pre Biometrics   Height 5' 4 (1.626 m)    Weight 150 lb 9.2 oz (68.3 kg)    Waist Circumference 35.5 inches    Hip Circumference 36 inches    Waist to Hip Ratio 0.99 %    BMI (Calculated) 25.83    Triceps Skinfold 7 mm    % Body Fat 30.4 %    Grip Strength 16 kg    Flexibility 11.5 in    Single Leg Stand 30 seconds           Nutrition Therapy Plan and Nutrition Goals:  Nutrition Therapy & Goals - 10/16/24 1330       Nutrition Therapy   Diet Heart Healthy    Drug/Food Interactions Coumadin /Vit K      Personal Nutrition Goals   Nutrition Goal Patient to identify strategies for reducing cardiovascular risk by attending the Pritikin education and nutrition series weekly.    Personal Goal #2 Patient to improve diet quality by using the plate method as a guide for meal planning to include lean protein/plant protein, fruits, vegetables, whole grains, nonfat dairy as part of a well-balanced diet.    Comments Patient with cardiac history of idiopathic cardiomyopathy, essential hypertension, severe MR (s/p MV repair with ring and chordal transposition 2010), aortic valve stenosis and aortic regurgitation (s/p mechanical AVR/MVR on 08/08/24). Pt reports limiting vitamin K-rich foods, particulary leafy greens. Verbalizes uncertainty regarding inclusion of heart healthy oils. RD encouraged pt to opt for plant-oils such as canola, olive and canola oil. Discussed need to limit oil intake due to high caloric density. RD also discussed importance of consistent vitamin K intake to help stabilize INR. Patient will benefit from participation in intensive cardiac rehab for  nutrition education, exercise, and lifestyle modification.      Intervention Plan   Intervention Prescribe, educate and counsel regarding individualized specific dietary modifications aiming towards targeted core components such as weight, hypertension, lipid management, diabetes, heart failure and other comorbidities.    Expected Outcomes Short  Term Goal: Understand basic principles of dietary content, such as calories, fat, sodium, cholesterol and nutrients.;Long Term Goal: Adherence to prescribed nutrition plan.          Nutrition Assessments:  MEDIFICTS Score Key: >=70 Need to make dietary changes  40-70 Heart Healthy Diet <= 40 Therapeutic Level Cholesterol Diet   Flowsheet Row INTENSIVE CARDIAC REHAB from 10/16/2024 in [facility]  Picture Your Plate Total Score on Admission 52   Picture Your Plate Scores: <59 Unhealthy dietary pattern with much room for improvement. 41-50 Dietary pattern unlikely to meet recommendations for good health and room for improvement. 51-60 More healthful dietary pattern, with some room for improvement.  >60 Healthy dietary pattern, although there may be some specific behaviors that could be improved.    Nutrition Goals Re-Evaluation:  Nutrition Goals Re-Evaluation     Row Name 11/16/24 1419             Goals   Current Weight 150 lb 5.7 oz (68.2 kg)  11/08/2024       Nutrition Goal Patient to identify strategies for reducing cardiovascular risk by attending the Pritikin education and nutrition series weekly.       Comment Stable wt while attending cardiac rehab.       Expected Outcome Goals in action. 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 Pt last attended cardiac rehab on 11/08/2024. Stable wt  noted at that time. Occasional attendance noted in nutrition education classes. Patient will benefit from participation in intensive cardiac rehab for nutrition education, exercise, and lifestyle modification.         Personal Goal #2 Re-Evaluation   Personal Goal #2 Patient to improve diet quality by using the plate method as a guide for meal planning to include lean protein/plant protein, fruits, vegetables, whole grains, nonfat dairy as part of a well-balanced diet.          Nutrition Goals Re-Evaluation:  Nutrition Goals Re-Evaluation     Row Name 11/16/24 1419             Goals   Current Weight 150 lb 5.7 oz (68.2 kg)  11/08/2024       Nutrition Goal Patient to identify strategies for reducing cardiovascular risk by attending the Pritikin education and nutrition series weekly.       Comment Stable wt while attending cardiac rehab.       Expected Outcome Goals in action. 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 Pt last attended cardiac rehab on 11/08/2024. Stable wt noted at that time. Occasional attendance noted in nutrition education classes. Patient will benefit from participation in intensive cardiac rehab for nutrition education, exercise, and lifestyle modification.         Personal Goal #2 Re-Evaluation   Personal Goal #2 Patient to improve diet quality by using the plate method as a guide for meal planning to include lean protein/plant protein, fruits, vegetables, whole grains, nonfat dairy as part of a well-balanced diet.          Nutrition Goals Discharge (Final Nutrition Goals Re-Evaluation):  Nutrition Goals Re-Evaluation - 11/16/24 1419       Goals   Current Weight 150  lb 5.7 oz (68.2 kg)   11/08/2024   Nutrition Goal Patient to identify strategies for reducing cardiovascular  risk by attending the Pritikin education and nutrition series weekly.    Comment Stable wt while attending cardiac rehab.    Expected Outcome Goals in action. 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 Pt last attended cardiac rehab on 11/08/2024. Stable wt noted at that time. Occasional attendance noted in nutrition education classes. Patient will benefit from participation in intensive cardiac rehab for nutrition education, exercise, and lifestyle modification.      Personal Goal #2 Re-Evaluation   Personal Goal #2 Patient to improve diet quality by using the plate method as a guide for meal planning to include lean protein/plant protein, fruits, vegetables, whole grains, nonfat dairy as part of a well-balanced diet.          Psychosocial: Target Goals: Acknowledge presence or absence of significant depression and/or stress, maximize coping skills, provide positive support system. Participant is able to verbalize types and ability to use techniques and skills needed for reducing stress and depression.  Initial Review & Psychosocial Screening:  Initial Psych Review & Screening - 10/12/24 1650       Initial Review   Current issues with Current Stress Concerns    Source of Stress Concerns Financial    Comments She has some mild stress about not working and her finances, but returns to work in 3 weeks      Family Dynamics   Good Support System? Yes      Barriers   Psychosocial barriers to participate in program The patient should benefit from training in stress management and relaxation.      Screening Interventions   Interventions Encouraged to exercise;Provide feedback about the scores to participant    Expected Outcomes Long Term Goal: Stressors or current issues are controlled or  eliminated.;Short Term goal: Identification and review with participant of any Quality of Life or Depression concerns found by scoring the questionnaire.;Long Term goal: The participant improves quality of Life and PHQ9 Scores as seen by post scores and/or verbalization of changes          Quality of Life Scores:  Quality of Life - 10/12/24 1651       Quality of Life   Select Quality of Life      Quality of Life Scores   Health/Function Pre 25.2 %    Socioeconomic Pre 29.06 %    Psych/Spiritual Pre 30 %    Family Pre 25.2 %    GLOBAL Pre 27.04 %         Scores of 19 and below usually indicate a poorer quality of life in these areas.  A difference of  2-3 points is a clinically meaningful difference.  A difference of 2-3 points in the total score of the Quality of Life Index has been associated with significant improvement in overall quality of life, self-image, physical symptoms, and general health in studies assessing change in quality of life.  PHQ-9: Review Flowsheet       10/12/2024  Depression screen PHQ 2/9  Decreased Interest 1  Down, Depressed, Hopeless 0  PHQ - 2 Score 1  Altered sleeping 0  Tired, decreased energy 1  Change in appetite 1  Feeling bad or failure about yourself  0  Trouble concentrating 0  Moving slowly or  fidgety/restless 0  Suicidal thoughts 0  PHQ-9 Score 3  Difficult doing work/chores Not difficult at all   Interpretation of Total Score  Total Score Depression Severity:  1-4 = Minimal depression, 5-9 = Mild depression, 10-14 = Moderate depression, 15-19 = Moderately severe depression, 20-27 = Severe depression   Psychosocial Evaluation and Intervention:   Psychosocial Re-Evaluation:  Psychosocial Re-Evaluation     Row Name 11/07/24 1419 12/05/24 1624           Psychosocial Re-Evaluation   Current issues with Current Stress Concerns Current Stress Concerns      Comments Davisha has not voiced any additional psychosocial  concerns or stressors during exercise at cardiac rehab Gennie has not voiced any additional psychosocial concerns or stressors during exercise at cardiac rehab      Expected Outcomes Chakita will experience controlled or decreased psychosocial concerns or stressors by completion of cardiac rehab Edwyna will experience controlled or decreased psychosocial concerns or stressors by completion of cardiac rehab      Interventions Stress management education;Encouraged to attend Cardiac Rehabilitation for the exercise;Relaxation education Stress management education;Encouraged to attend Cardiac Rehabilitation for the exercise;Relaxation education      Continue Psychosocial Services  No Follow up required No Follow up required         Psychosocial Discharge (Final Psychosocial Re-Evaluation):  Psychosocial Re-Evaluation - 12/05/24 1624       Psychosocial Re-Evaluation   Current issues with Current Stress Concerns    Comments Avabella has not voiced any additional psychosocial concerns or stressors during exercise at cardiac rehab    Expected Outcomes Jerusha will experience controlled or decreased psychosocial concerns or stressors by completion of cardiac rehab    Interventions Stress management education;Encouraged to attend Cardiac Rehabilitation for the exercise;Relaxation education    Continue Psychosocial Services  No Follow up required          Vocational Rehabilitation: Provide vocational rehab assistance to qualifying candidates.   Vocational Rehab Evaluation & Intervention:   Education: Education Goals: Education classes will be provided on a weekly basis, covering required topics. Participant will state understanding/return demonstration of topics presented.    Education     Row Name 10/16/24 1300     Education   Cardiac Education Topics Pritikin   Psychologist, Forensic Exercise Education   Exercise  Education Biomechanial Limitations   Instruction Review Code 1- [employer] Understanding    Row Name 10/18/24 1300     Education   Cardiac Education Topics Pritikin   Orthoptist   Educator Dietitian   Weekly Topic Fast Evening Meals   Instruction Review Code 1- Verbalizes Understanding   Class Start Time 1148   Class Stop Time 1232   Class Time Calculation (min) 44 min    Row Name 10/23/24 1300     Education   Cardiac Education Topics Pritikin   Psychologist, Forensic Exercise Education   Exercise Education Improving Performance   Instruction Review Code 1- Verbalizes Understanding   Class Start Time 1400   Class Stop Time 1443   Class Time Calculation (min) 43 min    Row Name 10/25/24 1300     Education   Cardiac Education Topics Pritikin   Customer Service Manager  Weekly Topic [employer]isine- Spotlight on the [facility] Zones   Instruction Review Code 1- Verbalizes Understanding   Class Start Time 1400   Class Stop Time 1437   Class Time Calculation (min) 37 min      Core Videos: Exercise    Move It!  Clinical staff conducted group or individual video education with verbal and written material and guidebook.  Patient learns the recommended Pritikin exercise program. Exercise with the goal of living a long, healthy life. Some of the health benefits of exercise include controlled diabetes, healthier blood pressure levels, improved cholesterol levels, improved heart and lung capacity, improved sleep, and better body composition. Everyone should speak with their doctor before starting or changing an exercise routine.  Biomechanical Limitations Clinical staff conducted group or individual video education with verbal and written material and guidebook.  Patient learns how biomechanical limitations can impact exercise and how we can mitigate  and possibly overcome limitations to have an impactful and balanced exercise routine.  Body Composition Clinical staff conducted group or individual video education with verbal and written material and guidebook.  Patient learns that body composition (ratio of muscle mass to fat mass) is a key component to assessing overall fitness, rather than body weight alone. Increased fat mass, especially visceral belly fat, can put us  at increased risk for metabolic syndrome, type 2 diabetes, heart disease, and even death. It is recommended to combine diet and exercise (cardiovascular and resistance training) to improve your body composition. Seek guidance from your physician and exercise physiologist before implementing an exercise routine.  Exercise Action Plan Clinical staff conducted group or individual video education with verbal and written material and guidebook.  Patient learns the recommended strategies to achieve and enjoy long-term exercise adherence, including variety, self-motivation, self-efficacy, and positive decision making. Benefits of exercise include fitness, good health, weight management, more energy, better sleep, less stress, and overall well-being.  Medical   Heart Disease Risk Reduction Clinical staff conducted group or individual video education with verbal and written material and guidebook.  Patient learns our heart is our most vital organ as it circulates oxygen, nutrients, white blood cells, and hormones throughout the entire body, and carries waste away. Data supports a plant-based eating plan like the Pritikin Program for its effectiveness in slowing progression of and reversing heart disease. The video provides a number of recommendations to address heart disease.   Metabolic Syndrome and Belly Fat  Clinical staff conducted group or individual video education with verbal and written material and guidebook.  Patient learns what metabolic syndrome is, how it leads to heart  disease, and how one can reverse it and keep it from coming back. You have metabolic syndrome if you have 3 of the following 5 criteria: abdominal obesity, high blood pressure, high triglycerides, low HDL cholesterol, and high blood sugar.  Hypertension and Heart Disease Clinical staff conducted group or individual video education with verbal and written material and guidebook.  Patient learns that high blood pressure, or hypertension, is very common in the [place] . Hypertension is largely due to excessive salt intake, but other important risk factors include being overweight, physical inactivity, drinking too much alcohol, smoking, and not eating enough potassium from fruits and vegetables. High blood pressure is a leading risk factor for heart attack, stroke, congestive heart failure, dementia, kidney failure, and premature death. Long-term effects of excessive salt intake include stiffening of the arteries and thickening of heart muscle and organ damage. Recommendations include ways to reduce hypertension and  the risk of heart disease.  Diseases of Our Time - Focusing on Diabetes Clinical staff conducted group or individual video education with verbal and written material and guidebook.  Patient learns why the best way to stop diseases of our time is prevention, through food and other lifestyle changes. Medicine (such as prescription pills and surgeries) is often only a Band-Aid on the problem, not a long-term solution. Most common diseases of our time include obesity, type 2 diabetes, hypertension, heart disease, and cancer. The Pritikin Program is recommended and has been proven to help reduce, reverse, and/or prevent the damaging effects of metabolic syndrome.  Nutrition   Overview of the Pritikin Eating Plan  Clinical staff conducted group or individual video education with verbal and written material and guidebook.  Patient learns about the Pritikin Eating Plan for disease risk reduction.  The Pritikin Eating Plan emphasizes a wide variety of unrefined, minimally-processed carbohydrates, like fruits, vegetables, whole grains, and legumes. Go, Caution, and Stop food choices are explained. Plant-based and lean animal proteins are emphasized. Rationale provided for low sodium intake for blood pressure control, low added sugars for blood sugar stabilization, and low added fats and oils for coronary artery disease risk reduction and weight management.  Calorie Density  Clinical staff conducted group or individual video education with verbal and written material and guidebook.  Patient learns about calorie density and how it impacts the Pritikin Eating Plan. Knowing the characteristics of the food you choose will help you decide whether those foods will lead to weight gain or weight loss, and whether you want to consume more or less of them. Weight loss is usually a side effect of the Pritikin Eating Plan because of its focus on low calorie-dense foods.  Label Reading  Clinical staff conducted group or individual video education with verbal and written material and guidebook.  Patient learns about the Pritikin recommended label reading guidelines and corresponding recommendations regarding calorie density, added sugars, sodium content, and whole grains.  Dining Out - Part 1  Clinical staff conducted group or individual video education with verbal and written material and guidebook.  Patient learns that restaurant meals can be sabotaging because they can be so high in calories, fat, sodium, and/or sugar. Patient learns recommended strategies on how to positively address this and avoid unhealthy pitfalls.  Facts on Fats  Clinical staff conducted group or individual video education with verbal and written material and guidebook.  Patient learns that lifestyle modifications can be just as effective, if not more so, as many medications for lowering your risk of heart disease. A Pritikin lifestyle  can help to reduce your risk of inflammation and atherosclerosis (cholesterol build-up, or plaque, in the artery walls). Lifestyle interventions such as dietary choices and physical activity address the cause of atherosclerosis. A review of the types of fats and their impact on blood cholesterol levels, along with dietary recommendations to reduce fat intake is also included.  Nutrition Action Plan  Clinical staff conducted group or individual video education with verbal and written material and guidebook.  Patient learns how to incorporate Pritikin recommendations into their lifestyle. Recommendations include planning and keeping personal health goals in mind as an important part of their success.  Healthy Mind-Set    Healthy Minds, Bodies, Hearts  Clinical staff conducted group or individual video education with verbal and written material and guidebook.  Patient learns how to identify when they are stressed. Video will discuss the impact of that stress, as well as the many  benefits of stress management. Patient will also be introduced to stress management techniques. The way we think, act, and feel has an impact on our hearts.  How Our Thoughts Can Heal Our Hearts  Clinical staff conducted group or individual video education with verbal and written material and guidebook.  Patient learns that negative thoughts can cause depression and anxiety. This can result in negative lifestyle behavior and serious health problems. Cognitive behavioral therapy is an effective method to help control our thoughts in order to change and improve our emotional outlook.  Additional Videos:  Exercise    Improving Performance  Clinical staff conducted group or individual video education with verbal and written material and guidebook.  Patient learns to use a non-linear approach by alternating intensity levels and lengths of time spent exercising to help burn more calories and lose more body fat. Cardiovascular  exercise helps improve heart health, metabolism, hormonal balance, blood sugar control, and recovery from fatigue. Resistance training improves strength, endurance, balance, coordination, reaction time, metabolism, and muscle mass. Flexibility exercise improves circulation, posture, and balance. Seek guidance from your physician and exercise physiologist before implementing an exercise routine and learn your capabilities and proper form for all exercise.  Introduction to Yoga  Clinical staff conducted group or individual video education with verbal and written material and guidebook.  Patient learns about yoga, a discipline of the coming together of mind, breath, and body. The benefits of yoga include improved flexibility, improved range of motion, better posture and core strength, increased lung function, weight loss, and positive self-image. Yogas heart health benefits include lowered blood pressure, healthier heart rate, decreased cholesterol and triglyceride levels, improved immune function, and reduced stress. Seek guidance from your physician and exercise physiologist before implementing an exercise routine and learn your capabilities and proper form for all exercise.  Medical   Aging: Enhancing Your Quality of Life  Clinical staff conducted group or individual video education with verbal and written material and guidebook.  Patient learns key strategies and recommendations to stay in good physical health and enhance quality of life, such as prevention strategies, having an advocate, securing a Health Care Proxy and Power of Attorney, and keeping a list of medications and system for tracking them. It also discusses how to avoid risk for bone loss.  Biology of Weight Control  Clinical staff conducted group or individual video education with verbal and written material and guidebook.  Patient learns that weight gain occurs because we consume more calories than we burn (eating more, moving less).  Even if your body weight is normal, you may have higher ratios of fat compared to muscle mass. Too much body fat puts you at increased risk for cardiovascular disease, heart attack, stroke, type 2 diabetes, and obesity-related cancers. In addition to exercise, following the Pritikin Eating Plan can help reduce your risk.  Decoding Lab Results  Clinical staff conducted group or individual video education with verbal and written material and guidebook.  Patient learns that lab test reflects one measurement whose values change over time and are influenced by many factors, including medication, stress, sleep, exercise, food, hydration, pre-existing medical conditions, and more. It is recommended to use the knowledge from this video to become more involved with your lab results and evaluate your numbers to speak with your doctor.   Diseases of Our Time - Overview  Clinical staff conducted group or individual video education with verbal and written material and guidebook.  Patient learns that according to the CDC, 50% to  70% of chronic diseases (such as obesity, type 2 diabetes, elevated lipids, hypertension, and heart disease) are avoidable through lifestyle improvements including healthier food choices, listening to satiety cues, and increased physical activity.  Sleep Disorders Clinical staff conducted group or individual video education with verbal and written material and guidebook.  Patient learns how good quality and duration of sleep are important to overall health and well-being. Patient also learns about sleep disorders and how they impact health along with recommendations to address them, including discussing with a physician.  Nutrition  Dining Out - Part 2 Clinical staff conducted group or individual video education with verbal and written material and guidebook.  Patient learns how to plan ahead and communicate in order to maximize their dining experience in a healthy and nutritious manner.  Included are recommended food choices based on the type of restaurant the patient is visiting.   Fueling a Banker conducted group or individual video education with verbal and written material and guidebook.  There is a strong connection between our food choices and our health. Diseases like obesity and type 2 diabetes are very prevalent and are in large-part due to lifestyle choices. The Pritikin Eating Plan provides plenty of food and hunger-curbing satisfaction. It is easy to follow, affordable, and helps reduce health risks.  Menu Workshop  Clinical staff conducted group or individual video education with verbal and written material and guidebook.  Patient learns that restaurant meals can sabotage health goals because they are often packed with calories, fat, sodium, and sugar. Recommendations include strategies to plan ahead and to communicate with the manager, chef, or server to help order a healthier meal.  Planning Your Eating Strategy  Clinical staff conducted group or individual video education with verbal and written material and guidebook.  Patient learns about the Pritikin Eating Plan and its benefit of reducing the risk of disease. The Pritikin Eating Plan does not focus on calories. Instead, it emphasizes high-quality, nutrient-rich foods. By knowing the characteristics of the foods, we choose, we can determine their calorie density and make informed decisions.  Targeting Your Nutrition Priorities  Clinical staff conducted group or individual video education with verbal and written material and guidebook.  Patient learns that lifestyle habits have a tremendous impact on disease risk and progression. This video provides eating and physical activity recommendations based on your personal health goals, such as reducing LDL cholesterol, losing weight, preventing or controlling type 2 diabetes, and reducing high blood pressure.  Vitamins and Minerals  Clinical staff  conducted group or individual video education with verbal and written material and guidebook.  Patient learns different ways to obtain key vitamins and minerals, including through a recommended healthy diet. It is important to discuss all supplements you take with your doctor.   Healthy Mind-Set    Smoking Cessation  Clinical staff conducted group or individual video education with verbal and written material and guidebook.  Patient learns that cigarette smoking and tobacco addiction pose a serious health risk which affects millions of people. Stopping smoking will significantly reduce the risk of heart disease, lung disease, and many forms of cancer. Recommended strategies for quitting are covered, including working with your doctor to develop a successful plan.  Culinary   Becoming a Set Designer conducted group or individual video education with verbal and written material and guidebook.  Patient learns that cooking at home can be healthy, cost-effective, quick, and puts them in control. Keys to cooking healthy recipes  will include looking at your recipe, assessing your equipment needs, planning ahead, making it simple, choosing cost-effective seasonal ingredients, and limiting the use of added fats, salts, and sugars.  Cooking - Breakfast and Snacks  Clinical staff conducted group or individual video education with verbal and written material and guidebook.  Patient learns how important breakfast is to satiety and nutrition through the entire day. Recommendations include key foods to eat during breakfast to help stabilize blood sugar levels and to prevent overeating at meals later in the day. Planning ahead is also a key component.  Cooking - Educational Psychologist conducted group or individual video education with verbal and written material and guidebook.  Patient learns eating strategies to improve overall health, including an approach to cook more at home.  Recommendations include thinking of animal protein as a side on your plate rather than center stage and focusing instead on lower calorie dense options like vegetables, fruits, whole grains, and plant-based proteins, such as beans. Making sauces in large quantities to freeze for later and leaving the skin on your vegetables are also recommended to maximize your experience.  Cooking - Healthy Salads and Dressing Clinical staff conducted group or individual video education with verbal and written material and guidebook.  Patient learns that vegetables, fruits, whole grains, and legumes are the foundations of the Pritikin Eating Plan. Recommendations include how to incorporate each of these in flavorful and healthy salads, and how to create homemade salad dressings. Proper handling of ingredients is also covered. Cooking - Soups and [employer] - Soups and Desserts Clinical staff conducted group or individual video education with verbal and written material and guidebook.  Patient learns that Pritikin soups and desserts make for easy, nutritious, and delicious snacks and meal components that are low in sodium, fat, sugar, and calorie density, while high in vitamins, minerals, and filling fiber. Recommendations include simple and healthy ideas for soups and desserts.   Overview     The Pritikin Solution Program Overview Clinical staff conducted group or individual video education with verbal and written material and guidebook.  Patient learns that the results of the Pritikin Program have been documented in more than 100 articles published in peer-reviewed journals, and the benefits include reducing risk factors for (and, in some cases, even reversing) high cholesterol, high blood pressure, type 2 diabetes, obesity, and more! An overview of the three key pillars of the Pritikin Program will be covered: eating well, doing regular exercise, and having a healthy mind-set.  WORKSHOPS   Exercise: Exercise Basics: Building Your Action Plan Clinical staff led group instruction and group discussion with PowerPoint presentation and patient guidebook. To enhance the learning environment the use of posters, models and videos may be added. At the conclusion of this workshop, patients will comprehend the difference between physical activity and exercise, as well as the benefits of incorporating both, into their routine. Patients will understand the FITT (Frequency, Intensity, Time, and Type) principle and how to use it to build an exercise action plan. In addition, safety concerns and other considerations for exercise and cardiac rehab will be addressed by the presenter. The purpose of this lesson is to promote a comprehensive and effective weekly exercise routine in order to improve patients overall level of fitness.   Managing Heart Disease: Your Path to a Healthier Heart Clinical staff led group instruction and group discussion with PowerPoint presentation and patient guidebook. To enhance the learning environment the use of posters, models and videos  may be added.At the conclusion of this workshop, patients will understand the anatomy and physiology of the heart. Additionally, they will understand how Pritikins three pillars impact the risk factors, the progression, and the management of heart disease.  The purpose of this lesson is to provide a high-level overview of the heart, heart disease, and how the Pritikin lifestyle positively impacts risk factors.  Exercise Biomechanics Clinical staff led group instruction and group discussion with PowerPoint presentation and patient guidebook. To enhance the learning environment the use of posters, models and videos may be added. Patients will learn how the structural parts of their bodies function and how these functions impact their daily activities, movement, and exercise. Patients will learn how to promote a neutral spine, learn how  to manage pain, and identify ways to improve their physical movement in order to promote healthy living. The purpose of this lesson is to expose patients to common physical limitations that impact physical activity. Participants will learn practical ways to adapt and manage aches and pains, and to minimize their effect on regular exercise. Patients will learn how to maintain good posture while sitting, walking, and lifting.  Balance Training and Fall Prevention  Clinical staff led group instruction and group discussion with PowerPoint presentation and patient guidebook. To enhance the learning environment the use of posters, models and videos may be added. At the conclusion of this workshop, patients will understand the importance of their sensorimotor skills (vision, proprioception, and the vestibular system) in maintaining their ability to balance as they age. Patients will apply a variety of balancing exercises that are appropriate for their current level of function. Patients will understand the common causes for poor balance, possible solutions to these problems, and ways to modify their physical environment in order to minimize their fall risk. The purpose of this lesson is to teach patients about the importance of maintaining balance as they age and ways to minimize their risk of falling.  WORKSHOPS   Nutrition:  Fueling a Ship Broker led group instruction and group discussion with PowerPoint presentation and patient guidebook. To enhance the learning environment the use of posters, models and videos may be added. Patients will review the foundational principles of the Pritikin Eating Plan and understand what constitutes a serving size in each of the food groups. Patients will also learn Pritikin-friendly foods that are better choices when away from home and review make-ahead meal and snack options. Calorie density will be reviewed and applied to three nutrition priorities:  weight maintenance, weight loss, and weight gain. The purpose of this lesson is to reinforce (in a group setting) the key concepts around what patients are recommended to eat and how to apply these guidelines when away from home by planning and selecting Pritikin-friendly options. Patients will understand how calorie density may be adjusted for different weight management goals.  Mindful Eating  Clinical staff led group instruction and group discussion with PowerPoint presentation and patient guidebook. To enhance the learning environment the use of posters, models and videos may be added. Patients will briefly review the concepts of the Pritikin Eating Plan and the importance of low-calorie dense foods. The concept of mindful eating will be introduced as well as the importance of paying attention to internal hunger signals. Triggers for non-hunger eating and techniques for dealing with triggers will be explored. The purpose of this lesson is to provide patients with the opportunity to review the basic principles of the Pritikin Eating Plan, discuss the value of eating  mindfully and how to measure internal cues of hunger and fullness using the Hunger Scale. Patients will also discuss reasons for non-hunger eating and learn strategies to use for controlling emotional eating.  Targeting Your Nutrition Priorities Clinical staff led group instruction and group discussion with PowerPoint presentation and patient guidebook. To enhance the learning environment the use of posters, models and videos may be added. Patients will learn how to determine their genetic susceptibility to disease by reviewing their family history. Patients will gain insight into the importance of diet as part of an overall healthy lifestyle in mitigating the impact of genetics and other environmental insults. The purpose of this lesson is to provide patients with the opportunity to assess their personal nutrition priorities by looking at  their family history, their own health history and current risk factors. Patients will also be able to discuss ways of prioritizing and modifying the Pritikin Eating Plan for their highest risk areas  Menu  Clinical staff led group instruction and group discussion with PowerPoint presentation and patient guidebook. To enhance the learning environment the use of posters, models and videos may be added. Using menus brought in from [employer], or printed from [employer], patients will apply the Pritikin dining out guidelines that were presented in the [employer] video. Patients will also be able to practice these guidelines in a variety of provided scenarios. The purpose of this lesson is to provide patients with the opportunity to practice hands-on learning of the Pritikin Dining Out guidelines with actual menus and practice scenarios.  Label Reading Clinical staff led group instruction and group discussion with PowerPoint presentation and patient guidebook. To enhance the learning environment the use of posters, models and videos may be added. Patients will review and discuss the Pritikin label reading guidelines presented in Pritikins Label Reading Educational series video. Using fool labels brought in from local grocery stores and markets, patients will apply the label reading guidelines and determine if the packaged food meet the Pritikin guidelines. The purpose of this lesson is to provide patients with the opportunity to review, discuss, and practice hands-on learning of the Pritikin Label Reading guidelines with actual packaged food labels. Cooking School  Pritikins [employer] are designed to teach patients ways to prepare quick, simple, and affordable recipes at home. The importance of nutritions role in chronic disease risk reduction is reflected in its emphasis in the overall Pritikin program. By learning how to prepare essential core Pritikin Eating  Plan recipes, patients will increase control over what they eat; be able to customize the flavor of foods without the use of added salt, sugar, or fat; and improve the quality of the food they consume. By learning a set of core recipes which are easily assembled, quickly prepared, and affordable, patients are more likely to prepare more healthy foods at home. These workshops focus on convenient breakfasts, simple entres, side dishes, and desserts which can be prepared with minimal effort and are consistent with nutrition recommendations for cardiovascular risk reduction. Cooking [employer] are taught by a armed forces logistics/support/administrative officer (RD) who has been trained by the [employer]. The chef or RD has a clear understanding of the importance of minimizing - if not completely eliminating - added fat, sugar, and sodium in recipes. Throughout the series of Cooking School Workshop sessions, patients will learn about healthy ingredients and efficient methods of cooking to build confidence in their capability to prepare    [employer] weekly topics:  Adding Flavor- Sodium-Free  Fast and Healthy Breakfasts  Powerhouse Plant-Based Proteins  Satisfying Salads and Dressings  Simple Sides and Sauces  International Cuisine-Spotlight on the Blue Zones  Delicious Desserts  Savory Soups  Efficiency Cooking - Meals in a Snap  Tasty Appetizers and Snacks  Comforting Weekend Breakfasts  One-Pot Wonders   Fast Evening Meals  Landscape Architect Your [facility] Plate  WORKSHOPS   Healthy Mindset (Psychosocial):  Focused Goals, Sustainable Changes Clinical staff led group instruction and group discussion with PowerPoint presentation and patient guidebook. To enhance the learning environment the use of posters, models and videos may be added. Patients will be able to apply effective goal setting strategies to establish at least one personal goal, and then take consistent, meaningful  action toward that goal. They will learn to identify common barriers to achieving personal goals and develop strategies to overcome them. Patients will also gain an understanding of how our mind-set can impact our ability to achieve goals and the importance of cultivating a positive and growth-oriented mind-set. The purpose of this lesson is to provide patients with a deeper understanding of how to set and achieve personal goals, as well as the tools and strategies needed to overcome common obstacles which may arise along the way.  From Head to Heart: The Power of a Healthy Outlook  Clinical staff led group instruction and group discussion with PowerPoint presentation and patient guidebook. To enhance the learning environment the use of posters, models and videos may be added. Patients will be able to recognize and describe the impact of emotions and mood on physical health. They will discover the importance of self-care and explore self-care practices which may work for them. Patients will also learn how to utilize the 4 Cs to cultivate a healthier outlook and better manage stress and challenges. The purpose of this lesson is to demonstrate to patients how a healthy outlook is an essential part of maintaining good health, especially as they continue their cardiac rehab journey.  Healthy Sleep for a Healthy Heart Clinical staff led group instruction and group discussion with PowerPoint presentation and patient guidebook. To enhance the learning environment the use of posters, models and videos may be added. At the conclusion of this workshop, patients will be able to demonstrate knowledge of the importance of sleep to overall health, well-being, and quality of life. They will understand the symptoms of, and treatments for, common sleep disorders. Patients will also be able to identify daytime and nighttime behaviors which impact sleep, and they will be able to apply these tools to help manage sleep-related  challenges. The purpose of this lesson is to provide patients with a general overview of sleep and outline the importance of quality sleep. Patients will learn about a few of the most common sleep disorders. Patients will also be introduced to the concept of sleep hygiene, and discover ways to self-manage certain sleeping problems through simple daily behavior changes. Finally, the workshop will motivate patients by clarifying the links between quality sleep and their goals of heart-healthy living.   Recognizing and Reducing Stress Clinical staff led group instruction and group discussion with PowerPoint presentation and patient guidebook. To enhance the learning environment the use of posters, models and videos may be added. At the conclusion of this workshop, patients will be able to understand the types of stress reactions, differentiate between acute and chronic stress, and recognize the impact that chronic stress has on their health. They will also be able to apply  different coping mechanisms, such as reframing negative self-talk. Patients will have the opportunity to practice a variety of stress management techniques, such as deep abdominal breathing, progressive muscle relaxation, and/or guided imagery.  The purpose of this lesson is to educate patients on the role of stress in their lives and to provide healthy techniques for coping with it.  Learning Barriers/Preferences:  Learning Barriers/Preferences - 10/12/24 1652       Learning Barriers/Preferences   Learning Barriers None    Learning Preferences Skilled Demonstration;Pictoral;Group Instruction;Individual Instruction;Written Material;Video          Education Topics:  Knowledge Questionnaire Score:  Knowledge Questionnaire Score - 10/12/24 1652       Knowledge Questionnaire Score   Pre Score 22/24          Core Components/Risk Factors/Patient Goals at Admission:  Personal Goals and Risk Factors at Admission - 10/12/24  1654       Core Components/Risk Factors/Patient Goals on Admission   Diabetes Yes    Intervention Provide education about signs/symptoms and action to take for hypo/hyperglycemia.;Provide education about proper nutrition, including hydration, and aerobic/resistive exercise prescription along with prescribed medications to achieve blood glucose in normal ranges: Fasting glucose 65-99 mg/dL    Expected Outcomes Short Term: Participant verbalizes understanding of the signs/symptoms and immediate care of hyper/hypoglycemia, proper foot care and importance of medication, aerobic/resistive exercise and nutrition plan for blood glucose control.;Long Term: Attainment of HbA1C < 7%.    Hypertension Yes    Intervention Provide education on lifestyle modifcations including regular physical activity/exercise, weight management, moderate sodium restriction and increased consumption of fresh fruit, vegetables, and low fat dairy, alcohol moderation, and smoking cessation.;Monitor prescription use compliance.    Expected Outcomes Short Term: Continued assessment and intervention until BP is < 140/48mm HG in hypertensive participants. < 130/82mm HG in hypertensive participants with diabetes, heart failure or chronic kidney disease.;Long Term: Maintenance of blood pressure at goal levels.    Lipids Yes    Intervention Provide education and support for participant on nutrition & aerobic/resistive exercise along with prescribed medications to achieve LDL 70mg , HDL >40mg .    Expected Outcomes Short Term: Participant states understanding of desired cholesterol values and is compliant with medications prescribed. Participant is following exercise prescription and nutrition guidelines.;Long Term: Cholesterol controlled with medications as prescribed, with individualized exercise RX and with personalized nutrition plan. Value goals: LDL < 70mg , HDL > 40 mg.    Stress Yes    Intervention Offer individual and/or small group  education and counseling on adjustment to heart disease, stress management and health-related lifestyle change. Teach and support self-help strategies.;Refer participants experiencing significant psychosocial distress to appropriate mental health specialists for further evaluation and treatment. When possible, include family members and significant others in education/counseling sessions.    Expected Outcomes Short Term: Participant demonstrates changes in health-related behavior, relaxation and other stress management skills, ability to obtain effective social support, and compliance with psychotropic medications if prescribed.;Long Term: Emotional wellbeing is indicated by absence of clinically significant psychosocial distress or social isolation.    Personal Goal Other Yes    Personal Goal ST:UB strength, ROM with sternal precautions LT: tune up for travel, endurance    Intervention Will continue to monitor pt and progress WL's as tolerated without sign or symptom    Expected Outcomes Pt will achieve her goals          Core Components/Risk Factors/Patient Goals Review:   Goals and Risk Factor Review  Row Name 11/07/24 1421 12/05/24 1624           Core Components/Risk Factors/Patient Goals Review   Personal Goals Review Diabetes;Hypertension;Lipids;Stress Diabetes;Hypertension;Lipids;Stress      Review Aubry is doing well with exercise at cardiac rehab since her 10/16/24 start. Vital signs have been stable, MET levels have been maintained Emillee has not attended cardiac rehab since 11/08/24 due to medical reasons.      Expected Outcomes Karris will continue to particpate in cardiac rehab for exercise nutrition and lifestyle modifications. Molli will continue to participate in cardiac rehab for exercise nutrition and lifestyle modifications.         Core Components/Risk Factors/Patient Goals at Discharge (Final Review):   Goals and Risk Factor Review - 12/05/24 1624        Core Components/Risk Factors/Patient Goals Review   Personal Goals Review Diabetes;Hypertension;Lipids;Stress    Review Darnella has not attended cardiac rehab since 11/08/24 due to medical reasons.    Expected Outcomes Kalesha will continue to participate in cardiac rehab for exercise nutrition and lifestyle modifications.          ITP Comments:  ITP Comments     Row Name 10/12/24 1325 10/16/24 1417 11/07/24 1417 12/05/24 1623     ITP Comments Wilbert Bihari, MD: Medical Director. Introduction to the [facility] Program/Intensive Cardiac Rehab. Initial orientation packet reviewed with patient. 30 Day ITP Review. Aleria started cardiac rehab today (10/16/24) and did well with exercise 30 Day ITP Review. Yasha is tolerating exercise well since she started cardiac rehab on 10/16/24 30 Day ITP Review. Dominick has not attended cardiac rehab since 11/08/24 due to medical reasons.       Comments: see ITP comments    [1]  Current Outpatient Medications:    acetaminophen  (TYLENOL ) 500 MG tablet, Take 500 mg by mouth every 6 (six) hours as needed for moderate pain (pain score 4-6)., Disp: , Rfl:    atorvastatin  (LIPITOR) 20 MG tablet, Take 20 mg by mouth daily., Disp: , Rfl:    enoxaparin  (LOVENOX ) 80 MG/0.8ML injection, Inject 0.7 mLs (70 mg total) into the skin every 12 (twelve) hours., Disp: 11.2 mL, Rfl: 0   furosemide (LASIX) 20 MG tablet, Take 20 mg by mouth as needed for fluid., Disp: , Rfl:    JARDIANCE 25 MG TABS tablet, Take 10 mg by mouth daily., Disp: , Rfl:    metoprolol  succinate (TOPROL -XL) 25 MG 24 hr tablet, Take 1 tablet (25 mg total) by mouth daily., Disp: 30 tablet, Rfl: 0   oxyCODONE (OXY IR/ROXICODONE) 5 MG immediate release tablet, Take 5 mg by mouth every 6 (six) hours as needed for severe pain (pain score 7-10)., Disp: , Rfl:    OZEMPIC, 0.25 OR 0.5 MG/DOSE, 2 MG/3ML SOPN, Inject 0.5 mg into the skin once a week., Disp: , Rfl:    pantoprazole   (PROTONIX ) 40 MG tablet, Take 1 tablet (40 mg total) by mouth 2 (two) times daily. Transition to once daily indefinitely after 8 weeks., Disp: 120 tablet, Rfl: 0   senna-docusate (SENOKOT-S) 8.6-50 MG tablet, Take 2 tablets by mouth daily as needed for mild constipation., Disp: , Rfl:    sucralfate  (CARAFATE ) 1 g tablet, Take 1 tablet (1 g total) by mouth 4 (four) times daily., Disp: 56 tablet, Rfl: 0   warfarin (COUMADIN ) 5 MG tablet, Take 5 mg by mouth daily., Disp: , Rfl:    zolpidem  (AMBIEN ) 10 MG tablet, Take 10 mg by mouth at bedtime as needed., Disp: ,  Rfl:  [2]  Social History Tobacco Use  Smoking Status Never  Smokeless Tobacco Not on file

## 2024-12-06 ENCOUNTER — Encounter (HOSPITAL_COMMUNITY)

## 2024-12-07 ENCOUNTER — Other Ambulatory Visit: Payer: Self-pay | Admitting: *Deleted

## 2024-12-07 ENCOUNTER — Ambulatory Visit (HOSPITAL_COMMUNITY)
Admission: RE | Admit: 2024-12-07 | Discharge: 2024-12-07 | Disposition: A | Discharge status: Home / Self Care | Source: Ambulatory Visit | Attending: Family Medicine | Admitting: Family Medicine

## 2024-12-07 VITALS — BP 150/82 | HR 95 | Temp 97.8°F | Resp 15

## 2024-12-07 DIAGNOSIS — D649 Anemia, unspecified: Secondary | ICD-10-CM

## 2024-12-07 MED ORDER — IRON SUCROSE 200 MG IVPB - SIMPLE MED
200.0000 mg | Freq: Once | Status: AC
  Administered 2024-12-07: 200 mg via INTRAVENOUS

## 2024-12-08 ENCOUNTER — Encounter (HOSPITAL_COMMUNITY): Admission: RE | Admit: 2024-12-08

## 2024-12-08 ENCOUNTER — Encounter (HOSPITAL_COMMUNITY): Payer: Self-pay

## 2024-12-11 ENCOUNTER — Encounter (HOSPITAL_COMMUNITY)

## 2024-12-12 ENCOUNTER — Inpatient Hospital Stay (HOSPITAL_COMMUNITY): Admission: RE | Admit: 2024-12-12 | Source: Ambulatory Visit

## 2024-12-13 ENCOUNTER — Encounter (HOSPITAL_COMMUNITY)

## 2024-12-15 ENCOUNTER — Encounter (HOSPITAL_COMMUNITY)

## 2024-12-15 NOTE — Telephone Encounter (Signed)
 Spoke to Toribio Ada, PA-C office [facility] phone [phone], fax [phone]) to inquire about anticoagulation clearance request response. Staff indicates that they returned clearance request to our office with the indication that we should reach out to patient, PCP, Dr Velna Skeeter for clearance as she prescribes the coumadin  for this patient.  New letter sent electronically to Dr Velna Skeeter.

## 2024-12-18 ENCOUNTER — Encounter (HOSPITAL_COMMUNITY)

## 2024-12-20 ENCOUNTER — Encounter (HOSPITAL_COMMUNITY)

## 2024-12-22 ENCOUNTER — Encounter (HOSPITAL_COMMUNITY)

## 2024-12-25 ENCOUNTER — Encounter (HOSPITAL_COMMUNITY)

## 2024-12-27 ENCOUNTER — Encounter (HOSPITAL_COMMUNITY)

## 2024-12-29 ENCOUNTER — Encounter (HOSPITAL_COMMUNITY)

## 2025-01-01 ENCOUNTER — Encounter (HOSPITAL_COMMUNITY)

## 2025-01-03 ENCOUNTER — Encounter (HOSPITAL_COMMUNITY)

## 2025-01-05 ENCOUNTER — Encounter (HOSPITAL_COMMUNITY)

## 2025-01-08 ENCOUNTER — Encounter (HOSPITAL_COMMUNITY)

## 2025-01-10 ENCOUNTER — Encounter (HOSPITAL_COMMUNITY)

## 2025-01-12 ENCOUNTER — Encounter (HOSPITAL_COMMUNITY)

## 2025-01-15 ENCOUNTER — Encounter (HOSPITAL_COMMUNITY)

## 2025-01-17 ENCOUNTER — Encounter (HOSPITAL_COMMUNITY)

## 2025-01-19 ENCOUNTER — Encounter (HOSPITAL_COMMUNITY)

## 2025-01-22 ENCOUNTER — Encounter (HOSPITAL_COMMUNITY)

## 2025-01-23 ENCOUNTER — Inpatient Hospital Stay: Admitting: Genetic Counselor

## 2025-01-24 ENCOUNTER — Encounter (HOSPITAL_COMMUNITY)

## 2025-01-26 ENCOUNTER — Encounter (HOSPITAL_COMMUNITY)

## 2025-01-29 ENCOUNTER — Encounter (HOSPITAL_COMMUNITY)

## 2025-01-31 ENCOUNTER — Encounter (HOSPITAL_COMMUNITY)

## 2025-02-01 ENCOUNTER — Encounter (HOSPITAL_COMMUNITY): Payer: Self-pay

## 2025-02-01 ENCOUNTER — Ambulatory Visit (HOSPITAL_COMMUNITY): Admit: 2025-02-01 | Admitting: Gastroenterology

## 2025-02-01 SURGERY — EGD (ESOPHAGOGASTRODUODENOSCOPY)
Anesthesia: Monitor Anesthesia Care

## 2025-02-02 ENCOUNTER — Encounter (HOSPITAL_COMMUNITY)
